# Patient Record
Sex: Male | Born: 1935 | Race: White | Hispanic: No | Marital: Married | Smoking: Former smoker
Health system: Southern US, Community
[De-identification: ages and names within clinical notes are randomized; demographics above are authoritative.]

## PROBLEM LIST (undated history)

## (undated) DIAGNOSIS — F0390 Unspecified dementia without behavioral disturbance: Secondary | ICD-10-CM

## (undated) DIAGNOSIS — K219 Gastro-esophageal reflux disease without esophagitis: Secondary | ICD-10-CM

## (undated) DIAGNOSIS — I2510 Atherosclerotic heart disease of native coronary artery without angina pectoris: Secondary | ICD-10-CM

## (undated) DIAGNOSIS — E119 Type 2 diabetes mellitus without complications: Secondary | ICD-10-CM

## (undated) DIAGNOSIS — I10 Essential (primary) hypertension: Secondary | ICD-10-CM

## (undated) DIAGNOSIS — R109 Unspecified abdominal pain: Secondary | ICD-10-CM

## (undated) DIAGNOSIS — M1990 Unspecified osteoarthritis, unspecified site: Secondary | ICD-10-CM

## (undated) HISTORY — DX: Unspecified abdominal pain: R10.9

---

## 1999-01-03 HISTORY — PX: COLONOSCOPY: SHX174

## 2002-10-01 ENCOUNTER — Inpatient Hospital Stay (HOSPITAL_COMMUNITY): Admission: EM | Admit: 2002-10-01 | Discharge: 2002-10-04 | Payer: Self-pay | Admitting: *Deleted

## 2002-10-01 ENCOUNTER — Encounter: Payer: Self-pay | Admitting: Cardiology

## 2003-12-16 ENCOUNTER — Ambulatory Visit (HOSPITAL_COMMUNITY): Admission: RE | Admit: 2003-12-16 | Discharge: 2003-12-16 | Payer: Self-pay | Admitting: *Deleted

## 2004-10-10 ENCOUNTER — Ambulatory Visit: Payer: Self-pay | Admitting: Cardiology

## 2005-05-15 ENCOUNTER — Ambulatory Visit: Payer: Self-pay | Admitting: Cardiology

## 2005-08-18 ENCOUNTER — Ambulatory Visit: Payer: Self-pay | Admitting: Cardiology

## 2005-10-10 ENCOUNTER — Ambulatory Visit: Payer: Self-pay | Admitting: Cardiology

## 2005-10-11 ENCOUNTER — Ambulatory Visit: Payer: Self-pay | Admitting: Cardiology

## 2006-03-05 ENCOUNTER — Ambulatory Visit: Payer: Self-pay | Admitting: Cardiology

## 2006-11-09 ENCOUNTER — Ambulatory Visit: Payer: Self-pay | Admitting: Cardiology

## 2007-10-14 ENCOUNTER — Ambulatory Visit: Payer: Self-pay | Admitting: Cardiology

## 2007-11-06 ENCOUNTER — Ambulatory Visit: Payer: Self-pay | Admitting: Cardiology

## 2007-11-08 ENCOUNTER — Ambulatory Visit: Payer: Self-pay | Admitting: Internal Medicine

## 2008-10-30 HISTORY — PX: PACEMAKER INSERTION: SHX728

## 2010-02-25 HISTORY — PX: SIGMOIDOSCOPY: SUR1295

## 2010-08-15 ENCOUNTER — Encounter (INDEPENDENT_AMBULATORY_CARE_PROVIDER_SITE_OTHER): Payer: Self-pay | Admitting: *Deleted

## 2010-09-13 ENCOUNTER — Ambulatory Visit: Payer: Self-pay | Admitting: Internal Medicine

## 2010-09-14 ENCOUNTER — Ambulatory Visit (HOSPITAL_COMMUNITY): Admission: RE | Admit: 2010-09-14 | Discharge: 2010-09-14 | Payer: Self-pay | Admitting: Internal Medicine

## 2010-09-28 ENCOUNTER — Ambulatory Visit (HOSPITAL_COMMUNITY): Admission: RE | Admit: 2010-09-28 | Discharge: 2010-09-28 | Payer: Self-pay | Admitting: Internal Medicine

## 2010-09-28 ENCOUNTER — Ambulatory Visit: Payer: Self-pay | Admitting: Internal Medicine

## 2010-09-28 HISTORY — PX: ESOPHAGOGASTRODUODENOSCOPY: SHX1529

## 2010-11-29 NOTE — Letter (Signed)
Summary: Generic Letter, Intro to Referring  [facility]  [address]   [place], [place] [address]   Phone: [phone]  Fax: [phone]      August 15, 2010             RE: Brad Klein   July 06, 1929                 [address]                 [place], [place]  [address]  Dear Brad Klein,  Patient cancelled his appointment on Aug 08, 2010 at 3pm with Richrd Prime            Sincerely,    Diana Eves  [facility] Ph: [phone]   Fax: [phone]

## 2011-01-10 LAB — H. PYLORI ANTIBODY, IGG: H Pylori IgG: <0.4 {ISR}

## 2011-03-17 NOTE — Discharge Summary (Signed)
NAME:  Brad Klein, Brad Klein NO.:  [id number]   MEDICAL RECORD NO.:  [id number]                   PATIENT TYPE:  INP   LOCATION:  2029                                 FACILITY:  [facility]   PHYSICIAN:  Darden Palmer., M.D.         DATE OF BIRTH:  08-26-1929   DATE OF ADMISSION:  10/01/2002  DATE OF DISCHARGE:  10/04/2002                                 DISCHARGE SUMMARY   FINAL DIAGNOSES:  1. Probable viral syndrome with flu.  2. Chest pain of undetermined etiology with positive  CPK-MB but negative     troponin I.  Catheterization showing moderate disease in proximal left     anterior descending artery.  3. Diabetes mellitus, insulin-dependent.  4. Hypertension.  5. Syncope of undetermined origin.   HISTORY OF PRESENT ILLNESS:  This 77 year old male had a prior history of an  LAD stenosis in 1979 and moderate disease in the right coronary artery.  The  evening of admission, he awoke to go the bathroom and he was feeling poorly  with diarrhea and had diaphoresis, dyspnea, nausea and vomiting and had a  syncopal episode.  He was taken to the emergency room and developed chest  pain when he walked to the stretcher when EMS picked him up.  He continued  to have mild to moderate chest pain and become hypertensive with  nitroglycerin and morphine.  CPK showed mild elevation, but troponin's were  normal.  He was transferred to [facility].  He had been feeling poorly  for several days prior to admission.  He was transferred from [facility] for further evaluation.  (Please see the previously dictated  History and Physical for remainder of the details).   HOSPITAL COURSE:  Lab data on admission showed hemoglobin of 12.9,  hematocrit of 38.  Sodium 126 on admission with a potassium of 4.5, chloride  was 94, glucose was 193 on admission.  AST was elevated at 38 and ALT was  elevated at 45.  CPK was 267 with MB of 9.5 and remained at this level  serially.  Troponin I's were all normal.  By the time of discharge, his  sodium had risen to 134.  He had an emergent cardiac catheterization done by  Meade Maw, M.D. when he came in showing a 50-60% ostial LAD stenosis.  Circumflex showed no significant disease.  Right coronary had a 40-50% mid  vessel stenosis.  His catheterization was reviewed and it was felt that he  had only moderate coronary disease.  Sheath was removed later on that day.  He had a syncopal episode while going down to ultrasound after having nausea  and diaphoresis.  He was reportedly in sinus rhythm with a bradycardia  around 50 at that time.  EKG remained unchanged.  He continued to be  somewhat hyponatremic and he was given intravenous fluids.  He had no more  nausea or  vomiting and felt better the next day and denied any chest pain  and his abdominal symptoms were better.  A gallbladder ultrasound did not  show any gallstones.  It was felt that he likely had vasovagal syncope and  most likely a viral syndrome with nausea and vomiting.  He remained in sinus  rhythm the next day, but was still aching all over and congested.  The  impression was that he had hyponatremia which was improving and had probable  flu with vasovagal syncope.  He was feeling well the next morning and was  discharged home to followup in one week.   DISCHARGE CONDITION:  Improved.   DISCHARGE DIET:  A low fat diet, diabetic diet.   DISCHARGE MEDICATIONS:  1. Lisinopril 40 mg daily.  2. Adenocard 40 mg daily.  3. Aspirin 325 daily.  4. Glyburide 5 mg daily.  5. Glucophage 500 mg twice daily.  6. Humalog 30 units in the morning and evening.  7. Humulin N 40 units in the morning and evening.  8. Zocor 20 mg daily.  9. Pepcid 20 mg daily.   DISCHARGE INSTRUCTIONS:  He is to call for an appointment to see me in one  week and to call if there were problems.  He is instructed to follow up also  with his regular medical doctor.                                                Darden Palmer., M.D.    WST/MEDQ  D:  11/06/2002  T:  11/06/2002  Job:  [id number]

## 2011-03-17 NOTE — Assessment & Plan Note (Signed)
[facility]                          [facility] OFFICE NOTE   Brad, Klein                        MRN:          [id number]  DATE:11/09/2006                            DOB:          Jul 25, 1929    PRIMARY CARDIOLOGIST:  Dr.  Simona Huh   REASON FOR VISIT:  Scheduled 6 month followup.   Brad Klein is a very pleasant 77 year old male, with history of  nonobstructive coronary artery disease by multiple prior cardiac  catheterizations, who now presents for scheduled followup.   Since last seen here in the clinic in May 2007; by Dr. Diona Browner, the  patient reports no interim development of any signs or symptoms  suggestive of unstable angina pectoris.  At time of last office visit,  recommendation was to discontinue Toprol regarding a possible relative  chronotropic  incompetence with underlying conductive system disease  which, in turn, could be precipitating exertional chest discomfort.  However, the patient reports to me today, that he was not able to  tolerate this given that his blood pressure significantly increased off  of the Toprol.  He was thus advised by our office to resume this  medication.   From a clinical standpoint, therefore, the patient continues to have  what appears to be chronic stable angina.  In fact, he continues to walk  on a regular basis and describes symptoms suggestive of warm-up angina  shortly after he starts walking, but which then completely resolves with  continued walking.  This has not increased in either intensity or  frequency.   CURRENT MEDICATIONS:  1. Benicar 40 daily.  2. Aspirin 81 daily.  3. Maxzide 37.5/25 daily.  4. Glyburide 5 b.i.d.  5. Metformin 500 t.i.d.  6. Prilosec OTC 20 daily.  7. Humulin N 40 units q. a.m./30 units q. p.m.  8. Humalog 30 units q. a.m./25 unites q. p.m.  9. Toprol XL 25 daily.  10.Norvasc 5 daily.   PHYSICAL EXAMINATION:  Blood pressure 132/70, pulse 68,  regular.  Weight  222.  GENERAL:  A 78 year old male sitting upright in no distress.  HEENT:  Normocephalic, atraumatic.  NECK:  Palpable bilateral carotid pulses without bruits.  LUNGS:  Clear to auscultation in all fields.  HEART:  Regular rate and rhythm (S1) no significant murmurs.  No rubs or  gallops.  ABDOMEN:  Soft, nontender.  EXTREMITIES:  Palpable distal pulses with no significant edema.  NEURO:  Flat affect, but no focal deficit.   REVIEW OF SYSTEMS:  No PND, orthopnea, or significant lower extremity  edema.  The remaining systems negative.   IMPRESSION:  1. Chronic stable angina pectoris.      a.     Suspect secondary to microvascular disease.      b.     Nonobstructive coronary artery disease by multiple prior       cardiac catheterizations, most recently February 2005.  2. Normal left ventricular function.  3. Dyslipidemia.  4. History of bi-fesicular block/right bundle branch block.  5. Insulin-dependent diabetes mellitus.  6. Hypertension.   PLAN:  1. The patient is  strongly recommended to resume taking a lipid      lowering agent.  He apparently had been on Zocor in the past, but      stopped this on his own, secondary to development of myalgias,      which he attributed to this medication.  However, I spoke at length      regarding the importance as well as current guideline      recommendations with respect to being on statin with known diabetes      mellitus.  The patient subsequently agreed to resuming his statin      and I will therefore place him on Crestor 10 q.d.  He will need a      follow up fasting lipids/liver profile in 3 months.  I have also      asked him to forward a copy of his upcoming fasting lipid profile      from Dr. Bonnita Levan office.  2. Schedule a return clinic followup with Dr. Simona Huh in 6      months.      Rozell Searing, PA-C  Electronically Signed      Jonelle Sidle, MD  Electronically Signed   GS/MedQ  DD:  11/09/2006  DT: 11/09/2006  Job #: [id number]   cc:   Doreen Beam

## 2011-03-17 NOTE — Letter (Signed)
November 11, 2007    Learta Codding, MD,FACC  [address]  [place], [place] [address]   RE:  EKIN, PILAR  MRN:  [id number]  /  DOB:  1929-08-22   Dear Michelle Piper:   It was a pleasure to see Brad Klein last Friday, November 08, 2007 at  your request regarding recurrent syncope.   He was admitted to [facility] and has had three episodes.  The  concern on evaluation was that he had bifascicular block.   His first episode occurred about 10 years.  His EKG apparently was  normal then.  He was eating supper and he developed abdominal pain.  He  went outside of the church and then came back in and lost consciousness.  Two years ago, he had another episode where he was in the bathroom and  got nauseated.  He sat on the tub.  The nausea increased and he went out  on the floor.  He was unable to stand for some time.   The most recent episode occurred having gone to the bathroom and  defecated.  He became quite nauseated again.  He had a prodrome that was  similar to the others in that he recognized what was happening to him.  He was noted by his wife to be quite diaphoretic and pale and then lost  consciousness.  We do not have access to the EMS records for his viral  illness with accompanying flu-like symptoms.  His catheterization  demonstrated normal left ventricular function, nonobstructive coronary  artery disease.  This was a number of years ago in 2003.  He also has a  history of hypertension.   PAST MEDICAL HISTORY:  In addition to above is notable for dyslipidemia,  diabetes, BPH, and neuropathy.   MEDICATIONS:  At the time of his hospitalization included:  1. Benicar 40 mg.  2. Norvasc 10 mg.  3. Aspirin.  4. Glucophage 500 mg t.i.d.  5. Toprol 25 mg.  6. Maxzide 37.5/25.  7. Glyburide 10 mg b.i.d.  8. Insulin.   ALLERGIES:  PENICILLIN.   REVIEW OF SYSTEMS:  Broadly negative.   FAMILY HISTORY:  Noncontributory.   PHYSICAL EXAMINATION:  GENERAL:  He is an elderly  Caucasian male  appearing his stated age of 84.  VITAL SIGNS:  Blood pressure 145/77, pulse 78, weight 247 pounds.  HEENT:  No icterus or xanthomata.  His neck veins were flat.  His  carotids were brisk and full bilaterally without bruits.  BACK:  Without kyphosis or scoliosis.  LUNGS:  Clear.  HEART:  Sounds were irregular, but S1 was diminished and there was an  early systolic murmur.  ABDOMEN:  Protuberant, but soft.  There was no midline pulsation or  hepatosplenomegaly.  EXTREMITIES:  Femoral pulses were 2+.  Distal pulses were intact.  There  is no cyanosis, clubbing, or edema.  NEUROLOGY:  Grossly normal.  SKIN:  Warm and dry.   Electrocardiogram demonstrated trifascicular block with right bundle  branch block, first degree AV block, and left anterior fascicular block.  A CardioNet monitor was obtained which demonstrated only some  irregularities of rhythm, but no significant change in rate.   IMPRESSION:  1. Recurrent syncope with a history of consistent with neurally      mediated syncope.  2. Trifascicular block.  3. Normal left ventricular function.  4. Diabetes.  5. Hypertension.   DISCUSSION:  Michelle Piper, Brad Klein has recurrent syncope with trifascicular  block.  Given his normal left ventricular function the likelihood of a  life-threatening ventricular arrhythmia is quite small.  Based on the  issue data from [place] in [place], patients with bundle branch block  have typically about a 50% likelihood of having heart block and/or sinus  node dysfunction as the cause of their syncope.  I suspect in this  gentleman that his likelihood is that or maybe less than that given the  strong history suggestive of a neurally mediated reflex.  I do think,  however, given his heart block baseline that loop recording is indicated  because there is some significant chance that an electrical abnormality  is responsible for his syncope as opposed to a neural reflex.  To that  end, I  think a loop recorder is probably the most useful tool in this  regard.   We will plan to proceed with that.  Thank you very much for asking [place] to  see him.    Sincerely,      Duke Salvia, MD, [facility]  Electronically Signed    SCK/MedQ  DD: 11/11/2007  DT: 11/11/2007  Job #: [id number]   CC:    Doreen Beam, MD

## 2011-03-17 NOTE — Cardiovascular Report (Signed)
NAME:  Brad Klein, Brad Klein                           ACCOUNT NO.:  [id number]   MEDICAL RECORD NO.:  [id number]                   PATIENT TYPE:  OIB   LOCATION:  2899                                 FACILITY:  [facility]   PHYSICIAN:  Rollene Rotunda, M.D.                DATE OF BIRTH:  Jan 23, 1929   DATE OF PROCEDURE:  DATE OF DISCHARGE:  12/16/2003                              CARDIAC CATHETERIZATION   PROCEDURES PERFORMED:  1. Left heart catheterization.  2. Coronary arteriography.   CARDIOLOGIST:  Rollene Rotunda, M.D.   INDICATIONS:  Evaluate patient with chest pain (substernal and suggestion of  unstable angina).  He had previous nonobstructive coronary disease on  catheterization most recently in December 2003.  He also had an abnormal  Cardiolite suggesting inferior wall infarct with reversibility, but well-  preserved ejection fraction.   PROCEDURAL NOTE:  Left heart catheterization was performed via the right  femoral artery.  The artery was cannulated using anterior wall puncture.  A  #6 French arterial sheath was inserted via the modified Seldinger technique.  Preformed Judkins and a pigtail catheter were utilized.   The patient tolerated the procedure well and left the lab in stable  condition.   RESULTS:   HEMODYNAMIC DATA:  LV 152/9.  AO 143/98.   ANGIOGRAPHIC DATA:  Coronaries  Left Main:  The left main was normal.   LAD:  The LAD had ostial long 30% stenosis followed by mid 50% stenosis  followed by diffuse luminal irregularities.   Circumflex:  The circumflex was large with diffuse luminal irregularities in  the AV groove.  There was a large mid obtuse marginal, which had luminal  irregularities.   Right Coronary Artery:  The right coronary artery is a large dominant vessel  with slow flow.  There was a mid shelf-like 50% stenosis.   VENTRICULOGRAPHIC DATA:  Left Ventriculogram:  The left ventriculogram was  obtained in the RAO projection.  The EF was 65%.   CONCLUSION:  Nonobstructive coronary disease.   I did review the old catheterization and reviewed the films with Dr.  Gerri Spore.  We both agree that the mid right coronary artery lesion is  unlikely to be obstructing any flow.  It is unchanged and identical to the  film in 2003 as is the left anterior descending lesion.   PLAN:  I discussed the results with the patient and the family.  No further  cardiovascular testing is suggested.  The patient will follow up with Dr.  Sherril Croon.                                               Rollene Rotunda, M.D.    JH/MEDQ  D:  12/16/2003  T:  12/17/2003  Job:  [id number]

## 2011-03-17 NOTE — Cardiovascular Report (Signed)
NAME:  Brad Klein, Brad Klein NO.:  [id number]   MEDICAL RECORD NO.:  [id number]                   PATIENT TYPE:  INP   LOCATION:  2923                                 FACILITY:  [facility]   PHYSICIAN:  Meade Maw, M.D.                 DATE OF BIRTH:  December 19, 1928   DATE OF PROCEDURE:  DATE OF DISCHARGE:                              CARDIAC CATHETERIZATION   PROCEDURES PERFORMED:  Left heart catheterization, single plane  ventriculogram, coronary angiography.   INDICATIONS FOR PROCEDURE:  Ongoing prolonged chest pain with elevated  cardiac enzymes.   DESCRIPTION OF PROCEDURE:  After obtaining written informed consent, the  patient was brought to the cardiac catheterization laboratory on an urgent  basis. The right groin was prepped and draped in the usual sterile fashion.  Local anesthesia was achieved using 1% Xylocaine. A 6 French hemostasis  sheath was placed into the right femoral artery using a modified Seldinger  technique. Selective coronary angiography was performed using a JL4, JR4  Judkins catheter.  However, a JL5 could have been easily used.  Single plane  ventriculogram was performed in the RAO position using a 6 French angled  pigtail catheter.  All catheter exchange were made with a guide wire.  The  hemostasis sheath was flushed following each engagement.   FINDINGS:  Aortic pressure was 138/62, LV pressure was 129/70, EDP was 16.  Single plane ventriculogram revealed normal wall motion.  The ejection  fraction was 65%.   CORONARY ANGIOGRAPHY:  Left main coronary artery:  The left main coronary  artery bifurcated into the left anterior descending and circumflex vessel.  There is no significant disease in the left main coronary artery.   Left anterior descending:  The left anterior descending gives rise to a  small diagonal #1, small diagonal  #2, a large septal perforator and then goes on to end as an apical branch.  There is an ostial  50% lesion noted in the left anterior descending.   Circumflex vessel:  The circumflex vessel was a moderate sized vessel giving  rise to a small OM-1, small OM-2, a large trifurcating OM-3 and an OM-4.  There are luminal irregularities noted in the circumflex vessel.   Right coronary artery:  The right coronary artery is a large ectatic artery  giving rise to two RV marginals, a large PDA and a large PL branch.  There  was a 40% mid vessel lesion noted in the right coronary artery.   FINAL IMPRESSION:  Noncritical coronary artery disease with preserved left  ventricular function.  There is no critical lesion to account for the  patient's bump in cardiac enzymes.   The patient is transferred back to coronary care unit.  The hemostasis  sheath will be left in place till 8 o'clock as the patient received Lovenox  at 2 a.m.  The films will be reviewed by Dr.  Viann Fish to determine if  intervention on the ostial LAD is warranted.                                                  Meade Maw, M.D.    HP/MEDQ  D:  10/01/2002  T:  10/01/2002  Job:  [id number]

## 2011-05-29 ENCOUNTER — Encounter (INDEPENDENT_AMBULATORY_CARE_PROVIDER_SITE_OTHER): Payer: Self-pay

## 2011-05-30 ENCOUNTER — Ambulatory Visit (INDEPENDENT_AMBULATORY_CARE_PROVIDER_SITE_OTHER): Payer: Self-pay | Admitting: Internal Medicine

## 2011-05-30 ENCOUNTER — Encounter (INDEPENDENT_AMBULATORY_CARE_PROVIDER_SITE_OTHER): Payer: Self-pay

## 2013-11-30 HISTORY — PX: INTRAMEDULLARY (IM) NAIL INTERTROCHANTERIC: SHX5875

## 2013-12-19 ENCOUNTER — Emergency Department (HOSPITAL_COMMUNITY): Payer: Medicare Other

## 2013-12-19 ENCOUNTER — Inpatient Hospital Stay (HOSPITAL_COMMUNITY)
Admission: EM | Admit: 2013-12-19 | Discharge: 2013-12-23 | DRG: 481 | Disposition: A | Discharge status: Skilled Nursing Facility | Payer: Medicare Other | Attending: Internal Medicine | Admitting: Internal Medicine

## 2013-12-19 ENCOUNTER — Encounter (HOSPITAL_COMMUNITY): Payer: Self-pay | Admitting: Emergency Medicine

## 2013-12-19 DIAGNOSIS — Y92009 Unspecified place in unspecified non-institutional (private) residence as the place of occurrence of the external cause: Secondary | ICD-10-CM

## 2013-12-19 DIAGNOSIS — S72009A Fracture of unspecified part of neck of unspecified femur, initial encounter for closed fracture: Secondary | ICD-10-CM

## 2013-12-19 DIAGNOSIS — S72143A Displaced intertrochanteric fracture of unspecified femur, initial encounter for closed fracture: Principal | ICD-10-CM | POA: Diagnosis present

## 2013-12-19 DIAGNOSIS — I2510 Atherosclerotic heart disease of native coronary artery without angina pectoris: Secondary | ICD-10-CM | POA: Diagnosis present

## 2013-12-19 DIAGNOSIS — E119 Type 2 diabetes mellitus without complications: Secondary | ICD-10-CM | POA: Diagnosis present

## 2013-12-19 DIAGNOSIS — I10 Essential (primary) hypertension: Secondary | ICD-10-CM | POA: Diagnosis present

## 2013-12-19 DIAGNOSIS — S72001A Fracture of unspecified part of neck of right femur, initial encounter for closed fracture: Secondary | ICD-10-CM | POA: Diagnosis present

## 2013-12-19 DIAGNOSIS — E871 Hypo-osmolality and hyponatremia: Secondary | ICD-10-CM | POA: Diagnosis present

## 2013-12-19 DIAGNOSIS — Z7982 Long term (current) use of aspirin: Secondary | ICD-10-CM

## 2013-12-19 DIAGNOSIS — Z950 Presence of cardiac pacemaker: Secondary | ICD-10-CM

## 2013-12-19 DIAGNOSIS — W010XXA Fall on same level from slipping, tripping and stumbling without subsequent striking against object, initial encounter: Secondary | ICD-10-CM | POA: Diagnosis present

## 2013-12-19 DIAGNOSIS — F0390 Unspecified dementia without behavioral disturbance: Secondary | ICD-10-CM | POA: Diagnosis present

## 2013-12-19 DIAGNOSIS — Z794 Long term (current) use of insulin: Secondary | ICD-10-CM

## 2013-12-19 DIAGNOSIS — D62 Acute posthemorrhagic anemia: Secondary | ICD-10-CM | POA: Diagnosis not present

## 2013-12-19 DIAGNOSIS — Z87891 Personal history of nicotine dependence: Secondary | ICD-10-CM

## 2013-12-19 HISTORY — DX: Essential (primary) hypertension: I10

## 2013-12-19 HISTORY — DX: Gastro-esophageal reflux disease without esophagitis: K21.9

## 2013-12-19 HISTORY — DX: Type 2 diabetes mellitus without complications: E11.9

## 2013-12-19 HISTORY — DX: Unspecified dementia, unspecified severity, without behavioral disturbance, psychotic disturbance, mood disturbance, and anxiety: F03.90

## 2013-12-19 HISTORY — DX: Atherosclerotic heart disease of native coronary artery without angina pectoris: I25.10

## 2013-12-19 HISTORY — DX: Unspecified osteoarthritis, unspecified site: M19.90

## 2013-12-19 LAB — CBC WITH DIFFERENTIAL/PLATELET
BASOS ABS: 0.1 10*3/uL (ref 0.0–0.1)
Basophils Relative: 0 % (ref 0–1)
Eosinophils Absolute: 0.2 10*3/uL (ref 0.0–0.7)
Eosinophils Relative: 2 % (ref 0–5)
HCT: 41.8 % (ref 39.0–52.0)
Hemoglobin: 15 g/dL (ref 13.0–17.0)
LYMPHS ABS: 1.5 10*3/uL (ref 0.7–4.0)
Lymphocytes Relative: 12 % (ref 12–46)
MCH: 31.4 pg (ref 26.0–34.0)
MCHC: 35.9 g/dL (ref 30.0–36.0)
MCV: 87.6 fL (ref 78.0–100.0)
Monocytes Absolute: 0.7 10*3/uL (ref 0.1–1.0)
Monocytes Relative: 5 % (ref 3–12)
NEUTROS ABS: 10.7 10*3/uL — AB (ref 1.7–7.7)
NEUTROS PCT: 81 % — AB (ref 43–77)
PLATELETS: 170 10*3/uL (ref 150–400)
RBC: 4.77 MIL/uL (ref 4.22–5.81)
RDW: 12.1 % (ref 11.5–15.5)
WBC: 13.1 10*3/uL — ABNORMAL HIGH (ref 4.0–10.5)

## 2013-12-19 LAB — BASIC METABOLIC PANEL
BUN: 22 mg/dL (ref 6–23)
CO2: 23 meq/L (ref 19–32)
Calcium: 9.5 mg/dL (ref 8.4–10.5)
Chloride: 95 mEq/L — ABNORMAL LOW (ref 96–112)
Creatinine, Ser: 0.82 mg/dL (ref 0.50–1.35)
GFR calc Af Amer: >90 mL/min (ref >90)
GFR, EST NON AFRICAN AMERICAN: 80 mL/min — AB (ref >90)
Glucose, Bld: 226 mg/dL — ABNORMAL HIGH (ref 70–99)
Potassium: 4.5 mEq/L (ref 3.7–5.3)
SODIUM: 131 meq/L — AB (ref 137–147)

## 2013-12-19 LAB — GLUCOSE, CAPILLARY: Glucose-Capillary: 210 mg/dL — ABNORMAL HIGH (ref 70–99)

## 2013-12-19 LAB — TYPE AND SCREEN
ABO/RH(D): A POS
ANTIBODY SCREEN: NEGATIVE

## 2013-12-19 LAB — CBG MONITORING, ED: Glucose-Capillary: 206 mg/dL — ABNORMAL HIGH (ref 70–99)

## 2013-12-19 LAB — ABO/RH: ABO/RH(D): A POS

## 2013-12-19 LAB — PROTIME-INR
INR: 1.11 (ref 0.00–1.49)
PROTHROMBIN TIME: 14.1 s (ref 11.6–15.2)

## 2013-12-19 MED ORDER — MORPHINE SULFATE 4 MG/ML IJ SOLN
4.0000 mg | Freq: Once | INTRAMUSCULAR | Status: AC
  Administered 2013-12-19: 4 mg via INTRAVENOUS
  Filled 2013-12-19: qty 1

## 2013-12-19 MED ORDER — PANTOPRAZOLE SODIUM 40 MG PO TBEC
80.0000 mg | DELAYED_RELEASE_TABLET | Freq: Every day | ORAL | Status: DC
  Administered 2013-12-21 – 2013-12-23 (×3): 80 mg via ORAL
  Filled 2013-12-19 (×3): qty 2

## 2013-12-19 MED ORDER — AMLODIPINE BESYLATE 10 MG PO TABS
10.0000 mg | ORAL_TABLET | Freq: Every day | ORAL | Status: DC
  Administered 2013-12-21 – 2013-12-23 (×3): 10 mg via ORAL
  Filled 2013-12-19 (×4): qty 1

## 2013-12-19 MED ORDER — DONEPEZIL HCL 5 MG PO TABS
5.0000 mg | ORAL_TABLET | Freq: Every day | ORAL | Status: DC
  Administered 2013-12-20 – 2013-12-22 (×5): 5 mg via ORAL
  Filled 2013-12-19 (×7): qty 1

## 2013-12-19 MED ORDER — MEMANTINE HCL ER 28 MG PO CP24
28.0000 mg | ORAL_CAPSULE | Freq: Every day | ORAL | Status: DC
  Administered 2013-12-20 – 2013-12-23 (×4): 28 mg via ORAL
  Filled 2013-12-19 (×4): qty 28

## 2013-12-19 MED ORDER — MORPHINE SULFATE 2 MG/ML IJ SOLN
0.5000 mg | INTRAMUSCULAR | Status: DC | PRN
  Administered 2013-12-19: 0.5 mg via INTRAVENOUS
  Filled 2013-12-19: qty 1

## 2013-12-19 MED ORDER — SODIUM CHLORIDE 0.9 % IV SOLN
1000.0000 mL | INTRAVENOUS | Status: DC
  Administered 2013-12-19: 1000 mL via INTRAVENOUS

## 2013-12-19 MED ORDER — AMLODIPINE BESY-BENAZEPRIL HCL 10-20 MG PO CAPS: 1.0000 | ORAL_CAPSULE | Freq: Every day | ORAL | Status: DC

## 2013-12-19 MED ORDER — INSULIN GLARGINE 100 UNIT/ML ~~LOC~~ SOLN
25.0000 [IU] | Freq: Every day | SUBCUTANEOUS | Status: DC
  Administered 2013-12-20: 25 [IU] via SUBCUTANEOUS
  Filled 2013-12-19 (×2): qty 0.25

## 2013-12-19 MED ORDER — HYDROCODONE-ACETAMINOPHEN 5-325 MG PO TABS: 1.0000 | ORAL_TABLET | ORAL | Status: DC | PRN

## 2013-12-19 MED ORDER — ONDANSETRON HCL 4 MG/2ML IJ SOLN: 4.0000 mg | Freq: Three times a day (TID) | INTRAMUSCULAR | Status: DC | PRN

## 2013-12-19 MED ORDER — INSULIN ASPART 100 UNIT/ML ~~LOC~~ SOLN
0.0000 [IU] | SUBCUTANEOUS | Status: DC
  Administered 2013-12-19: 5 [IU] via SUBCUTANEOUS
  Administered 2013-12-20: 2 [IU] via SUBCUTANEOUS
  Administered 2013-12-20: 3 [IU] via SUBCUTANEOUS
  Administered 2013-12-20: 5 [IU] via SUBCUTANEOUS
  Administered 2013-12-20: 8 [IU] via SUBCUTANEOUS
  Administered 2013-12-20 – 2013-12-21 (×2): 3 [IU] via SUBCUTANEOUS
  Administered 2013-12-21: 5 [IU] via SUBCUTANEOUS
  Filled 2013-12-19: qty 1

## 2013-12-19 MED ORDER — HYDROCODONE-ACETAMINOPHEN 5-325 MG PO TABS
1.0000 | ORAL_TABLET | Freq: Four times a day (QID) | ORAL | Status: DC | PRN
  Administered 2013-12-19: 1 via ORAL
  Administered 2013-12-20 – 2013-12-23 (×8): 2 via ORAL
  Filled 2013-12-19: qty 2
  Filled 2013-12-19: qty 1
  Filled 2013-12-19 (×8): qty 2

## 2013-12-19 MED ORDER — BENAZEPRIL HCL 20 MG PO TABS
20.0000 mg | ORAL_TABLET | Freq: Every day | ORAL | Status: DC
  Administered 2013-12-20 – 2013-12-23 (×4): 20 mg via ORAL
  Filled 2013-12-19 (×4): qty 1

## 2013-12-19 MED ORDER — POTASSIUM CHLORIDE IN NACL 20-0.45 MEQ/L-% IV SOLN
INTRAVENOUS | Status: DC
  Filled 2013-12-19 (×2): qty 1000

## 2013-12-19 MED ORDER — CHLORHEXIDINE GLUCONATE 4 % EX LIQD
60.0000 mL | Freq: Once | CUTANEOUS | Status: AC
  Administered 2013-12-20: 4 via TOPICAL
  Filled 2013-12-19: qty 60

## 2013-12-19 MED ORDER — ASPIRIN EC 325 MG PO TBEC
325.0000 mg | DELAYED_RELEASE_TABLET | Freq: Every day | ORAL | Status: DC
Start: 2013-12-20 — End: 2013-12-23
  Administered 2013-12-21 – 2013-12-23 (×3): 325 mg via ORAL
  Filled 2013-12-19 (×4): qty 1

## 2013-12-19 MED ORDER — ASPIRIN 81 MG PO TABS: 81.0000 mg | ORAL_TABLET | Freq: Every day | ORAL | Status: DC

## 2013-12-19 MED ORDER — LISINOPRIL 40 MG PO TABS
40.0000 mg | ORAL_TABLET | Freq: Every day | ORAL | Status: DC
  Administered 2013-12-20 – 2013-12-22 (×3): 40 mg via ORAL
  Filled 2013-12-19 (×6): qty 1

## 2013-12-19 MED ORDER — SODIUM CHLORIDE 0.9 % IV SOLN
INTRAVENOUS | Status: DC
  Administered 2013-12-19 – 2013-12-21 (×2): via INTRAVENOUS

## 2013-12-19 NOTE — ED Notes (Signed)
Phlebotomy to bedside, taking pt to xray. Will call when he returns.

## 2013-12-19 NOTE — Consult Note (Signed)
Reason for Consult: Right intertrochanteric hip fracture Referring Physician: Dr. [name] is an 80 y.o. male.  HPI: Patient 80 year old gentleman with dementia who slipped on ice and sustained a right intertrochanteric hip fracture.  Past Medical History  Diagnosis Date  . Abdominal pain, other specified site   . Duodenal ulcer   . Dementia   . Diabetes mellitus without complication   . Hypertension     Past Surgical History  Procedure Laterality Date  . Esophagogastroduodenoscopy  09/28/2010    egd/tcs  . Sigmoidoscopy  02/25/2010  . Colonoscopy  01/03/1999  . Pacemaker insertion  10/2008    No family history on file.  Social History:  reports that he has quit smoking. He does not have any smokeless tobacco history on file. He reports that he does not drink alcohol. His drug history is not on file.  Allergies:  Allergies  Allergen Reactions  . Penicillins Other (See Comments)    Medications: I have reviewed the patient's current medications.  Results for orders placed during the hospital encounter of 12/19/13 (from the past 48 hour(s))  BASIC METABOLIC PANEL     Status: Abnormal   Collection Time    12/19/13  6:33 PM      Result Value Ref Range   Sodium 131 (*) 137 - 147 mEq/L   Potassium 4.5  3.7 - 5.3 mEq/L   Chloride 95 (*) 96 - 112 mEq/L   CO2 23  19 - 32 mEq/L   Glucose, Bld 226 (*) 70 - 99 mg/dL   BUN 22  6 - 23 mg/dL   Creatinine, Ser 0.82  0.50 - 1.35 mg/dL   Calcium 9.5  8.4 - 10.5 mg/dL   GFR calc non Af Amer 80 (*) >90 mL/min   GFR calc Af Amer >90  >90 mL/min   Comment: (NOTE)     The eGFR has been calculated using the CKD EPI equation.     This calculation has not been validated in all clinical situations.     eGFR's persistently <90 mL/min signify possible Chronic Kidney     Disease.  CBC WITH DIFFERENTIAL     Status: Abnormal   Collection Time    12/19/13  6:33 PM      Result Value Ref Range   WBC 13.1 (*) 4.0 - 10.5 K/uL   RBC 4.77  4.22 - 5.81 MIL/uL   Hemoglobin 15.0  13.0 - 17.0 g/dL   HCT 41.8  39.0 - 52.0 %   MCV 87.6  78.0 - 100.0 fL   MCH 31.4  26.0 - 34.0 pg   MCHC 35.9  30.0 - 36.0 g/dL   RDW 12.1  11.5 - 15.5 %   Platelets 170  150 - 400 K/uL   Neutrophils Relative % 81 (*) 43 - 77 %   Neutro Abs 10.7 (*) 1.7 - 7.7 K/uL   Lymphocytes Relative 12  12 - 46 %   Lymphs Abs 1.5  0.7 - 4.0 K/uL   Monocytes Relative 5  3 - 12 %   Monocytes Absolute 0.7  0.1 - 1.0 K/uL   Eosinophils Relative 2  0 - 5 %   Eosinophils Absolute 0.2  0.0 - 0.7 K/uL   Basophils Relative 0  0 - 1 %   Basophils Absolute 0.1  0.0 - 0.1 K/uL  PROTIME-INR     Status: None   Collection Time    12/19/13  6:33 PM  Result Value Ref Range   Prothrombin Time 14.1  11.6 - 15.2 seconds   INR 1.11  0.00 - 1.49  TYPE AND SCREEN     Status: None   Collection Time    12/19/13  6:33 PM      Result Value Ref Range   ABO/RH(D) A POS     Antibody Screen NEG     Sample Expiration 12/22/2013    ABO/RH     Status: None   Collection Time    12/19/13  6:33 PM      Result Value Ref Range   ABO/RH(D) A POS      Dg Chest 1 View  12/19/2013   CLINICAL DATA:  Fall, diabetes, hypertension, right hip fracture  EXAM: CHEST - 1 VIEW  COMPARISON:  12/15/2013  FINDINGS: Left subclavian 2 lead pacer noted. Normal heart size and vascularity. Mild hyperinflation without edema or pneumonia. No effusion or pneumothorax. Trachea midline. Tortuous ectatic thoracic aorta noted. Degenerative changes of the spine with an upper thoracic scoliosis.  IMPRESSION: No superimposed acute process   Electronically Signed   By: Daryll Brod M.D.   On: 12/19/2013 19:01   Dg Hip Complete Right  12/19/2013   CLINICAL DATA:  [place] and injured right hip.  EXAM: RIGHT HIP - COMPLETE 2+ VIEW  COMPARISON:  None.  FINDINGS: Comminuted intertrochanteric right femoral neck fracture. Right hip joint intact with moderate joint space narrowing.  Included AP pelvis demonstrates  no fractures elsewhere. Contralateral left hip joint intact with symmetric moderate joint space narrowing. Sacroiliac joints and symphysis pubis intact. Mild degenerative changes involving the visualized lower lumbar spine.  IMPRESSION: Comminuted intertrochanteric right femoral neck fracture.   Electronically Signed   By: Evangeline Dakin M.D.   On: 12/19/2013 18:52    Review of Systems  All other systems reviewed and are negative.   Blood pressure 123/65, pulse 59, temperature 98.7 F (37.1 C), temperature source Oral, resp. rate 20, SpO2 100.00%. Physical Exam On examination patient's right lower extremity is shortened and externally rotated. His foot is warm and neurovascularly intact. Radiographs shows a displaced intertrochanteric hip fracture on the right. Assessment/Plan: Assessment: Right intertrochanteric hip fracture.  Plan: We'll plan for intramedullary nail fixation of the hip fracture. Risks and benefits were discussed with the patient and the family including infection neurovascular injury DVT pulmonary embolus need for additional surgery. Patient and family state they understand and wish to proceed at this time. With patient's dementia patient may most likely not be able to ambulate again due to mental status. Anticipate patient will need skilled nursing at discharge.  Brad Klein V 12/19/2013, 8:20 PM

## 2013-12-19 NOTE — ED Notes (Signed)
MD at bedside. 

## 2013-12-19 NOTE — ED Notes (Signed)
Patient transported to X-ray 

## 2013-12-19 NOTE — ED Provider Notes (Signed)
CSN: [id number]     Arrival date & time 12/19/13  1641 History   First MD Initiated Contact with Patient 12/19/13 1649     Chief Complaint  Patient presents with  . Hip Pain     (Consider location/radiation/quality/duration/timing/severity/associated sxs/prior Treatment) HPI 80 year old male with history of dementia pacemaker diabetes was walking down his driveway to check the mail today when he slipped and fell landing in his right hip since with isolated right hip pain only with right leg shortening with external rotation unable to bear weight on his right leg since the fall with no other injury no head or neck injury no amnesia no neck pain chest pain shortness breath palpitations abdominal pain back pain or injury to his arms or his left leg. He is right hip pain only without radiation or associated weakness or numbness. His pain was moderately severe and treated with morphine prior to arrival by EMS does not want pain medication now upon arrival. Past Medical History  Diagnosis Date  . Abdominal pain, other specified site   . Duodenal ulcer   . Dementia   . Diabetes mellitus without complication   . Hypertension    Past Surgical History  Procedure Laterality Date  . Esophagogastroduodenoscopy  09/28/2010    egd/tcs  . Sigmoidoscopy  02/25/2010  . Colonoscopy  01/03/1999  . Pacemaker insertion  10/2008   No family history on file. History  Substance Use Topics  . Smoking status: Former Games developer  . Smokeless tobacco: Not on file  . Alcohol Use: No    Review of Systems 10 Systems reviewed and are negative for acute change except as noted in the HPI.   Allergies  Penicillins  Home Medications   No current outpatient prescriptions on file. BP 113/72  Pulse 67  Temp(Src) 98.1 F (36.7 C) (Oral)  Resp 18  SpO2 100% Physical Exam  Nursing note and vitals reviewed. Constitutional:  Awake, alert, nontoxic appearance.  HENT:  Head: Atraumatic.  Eyes: Right eye  exhibits no discharge. Left eye exhibits no discharge.  Neck: Neck supple.  Cervical spine nontender  Cardiovascular: Normal rate and regular rhythm.   No murmur heard. Pulmonary/Chest: Effort normal and breath sounds normal. No respiratory distress. He has no wheezes. He has no rales. He exhibits no tenderness.  Abdominal: Soft. He exhibits no distension. There is no tenderness. There is no rebound and no guarding.  Musculoskeletal: He exhibits tenderness. He exhibits no edema.  Baseline ROM, no obvious new focal weakness. Both arms and left leg nontender with good movement. Right leg is tender at the right hip only with decreased movement of the hip due to pain. Right knee ankle and foot are nontender. Right foot his capillary refill less than 2 seconds normal light touch good movement.  Neurological: He is alert.  Mental status and motor strength appears baseline for patient and situation.  Skin: No rash noted.  Psychiatric: He has a normal mood and affect.    ED Course  Procedures (including critical care time) D/w Ortho and Med for admit. Patient / Family / Caregiver understand and agree with initial ED impression and plan with expectations set for ED visit. Labs Review Labs Reviewed  SURGICAL PCR SCREEN - Abnormal; Notable for the following:    Staphylococcus aureus POSITIVE (*)    All other components within normal limits  BASIC METABOLIC PANEL - Abnormal; Notable for the following:    Sodium 131 (*)    Chloride 95 (*)  Glucose, Bld 226 (*)    GFR calc non Af Amer 80 (*)    All other components within normal limits  CBC WITH DIFFERENTIAL - Abnormal; Notable for the following:    WBC 13.1 (*)    Neutrophils Relative % 81 (*)    Neutro Abs 10.7 (*)    All other components within normal limits  GLUCOSE, CAPILLARY - Abnormal; Notable for the following:    Glucose-Capillary 210 (*)    All other components within normal limits  GLUCOSE, CAPILLARY - Abnormal; Notable for the  following:    Glucose-Capillary 196 (*)    All other components within normal limits  GLUCOSE, CAPILLARY - Abnormal; Notable for the following:    Glucose-Capillary 175 (*)    All other components within normal limits  GLUCOSE, CAPILLARY - Abnormal; Notable for the following:    Glucose-Capillary 170 (*)    All other components within normal limits  GLUCOSE, CAPILLARY - Abnormal; Notable for the following:    Glucose-Capillary 191 (*)    All other components within normal limits  CBG MONITORING, ED - Abnormal; Notable for the following:    Glucose-Capillary 206 (*)    All other components within normal limits  PROTIME-INR  TYPE AND SCREEN  ABO/RH   Imaging Review Dg Chest 1 View  12/19/2013   CLINICAL DATA:  Fall, diabetes, hypertension, right hip fracture  EXAM: CHEST - 1 VIEW  COMPARISON:  12/15/2013  FINDINGS: Left subclavian 2 lead pacer noted. Normal heart size and vascularity. Mild hyperinflation without edema or pneumonia. No effusion or pneumothorax. Trachea midline. Tortuous ectatic thoracic aorta noted. Degenerative changes of the spine with an upper thoracic scoliosis.  IMPRESSION: No superimposed acute process   Electronically Signed   By: Ruel Favorsrevor  Shick M.D.   On: 12/19/2013 19:01   Dg Hip Complete Right  12/19/2013   CLINICAL DATA:  Larey SeatFell and injured right hip.  EXAM: RIGHT HIP - COMPLETE 2+ VIEW  COMPARISON:  None.  FINDINGS: Comminuted intertrochanteric right femoral neck fracture. Right hip joint intact with moderate joint space narrowing.  Included AP pelvis demonstrates no fractures elsewhere. Contralateral left hip joint intact with symmetric moderate joint space narrowing. Sacroiliac joints and symphysis pubis intact. Mild degenerative changes involving the visualized lower lumbar spine.  IMPRESSION: Comminuted intertrochanteric right femoral neck fracture.   Electronically Signed   By: Hulan Saashomas  Lawrence M.D.   On: 12/19/2013 18:52   Dg Hip Operative Right  12/20/2013    CLINICAL DATA:  Fracture  EXAM: DG OPERATIVE RIGHT HIP  COMPARISON:  None.  FINDINGS: Dynamic compression screw and intra medullary rod are seen transfixing an intertrochanteric femur fracture. Anatomic alignment. No breakage or loosening of the hardware.  IMPRESSION: ORIF intertrochanteric right femur fracture.   Electronically Signed   By: Maryclare BeanArt  Hoss M.D.   On: 12/20/2013 14:00      MDM   Final diagnoses:  Hip fracture, right    The patient appears reasonably stabilized for admission considering the current resources, flow, and capabilities available in the ED at this time, and I doubt any other [facility] further screening and/or treatment in the ED prior to admission.    Hurman HornJohn M Amisha Pospisil, MD 12/20/13 1435

## 2013-12-19 NOTE — ED Notes (Signed)
Per EMS- Pt slipped walking down the driveway to get the mail. Has shortening and rotation to right leg, c/o right hip pain. Given 2 morphine, 4 mg morphine. Has 18 gauge to right wrist. Pt is a x 4. Has hx of dementia. BP 124/62, HR 60, 99% RA.
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
## 2013-12-19 NOTE — ED Notes (Signed)
Called xray, and they report patient is next for transport.

## 2013-12-20 ENCOUNTER — Encounter (HOSPITAL_COMMUNITY)
Admission: EM | Disposition: A | Discharge status: Skilled Nursing Facility | Payer: Self-pay | Source: Home / Self Care | Attending: Internal Medicine

## 2013-12-20 ENCOUNTER — Inpatient Hospital Stay (HOSPITAL_COMMUNITY): Payer: Medicare Other

## 2013-12-20 ENCOUNTER — Encounter (HOSPITAL_COMMUNITY): Payer: Self-pay | Admitting: Anesthesiology

## 2013-12-20 ENCOUNTER — Encounter (HOSPITAL_COMMUNITY): Payer: Medicare Other | Admitting: Certified Registered"

## 2013-12-20 ENCOUNTER — Inpatient Hospital Stay (HOSPITAL_COMMUNITY): Payer: Medicare Other | Admitting: Certified Registered"

## 2013-12-20 HISTORY — PX: INTRAMEDULLARY (IM) NAIL INTERTROCHANTERIC: SHX5875

## 2013-12-20 LAB — GLUCOSE, CAPILLARY
GLUCOSE-CAPILLARY: 196 mg/dL — AB (ref 70–99)
GLUCOSE-CAPILLARY: 175 mg/dL — AB (ref 70–99)
GLUCOSE-CAPILLARY: 170 mg/dL — AB (ref 70–99)
GLUCOSE-CAPILLARY: 185 mg/dL — AB (ref 70–99)
Glucose-Capillary: 191 mg/dL — ABNORMAL HIGH (ref 70–99)
Glucose-Capillary: 143 mg/dL — ABNORMAL HIGH (ref 70–99)

## 2013-12-20 LAB — SURGICAL PCR SCREEN
MRSA, PCR: NEGATIVE
Staphylococcus aureus: POSITIVE — AB

## 2013-12-20 SURGERY — FIXATION, FRACTURE, INTERTROCHANTERIC, WITH INTRAMEDULLARY ROD
Anesthesia: General | Site: Hip | Laterality: Right

## 2013-12-20 MED ORDER — ROCURONIUM BROMIDE 100 MG/10ML IV SOLN
INTRAVENOUS | Status: DC | PRN
  Administered 2013-12-20: 25 mg via INTRAVENOUS

## 2013-12-20 MED ORDER — SODIUM CHLORIDE 0.9 % IR SOLN
Status: DC | PRN
  Administered 2013-12-20: 500 mL

## 2013-12-20 MED ORDER — PHENYLEPHRINE HCL 10 MG/ML IJ SOLN
INTRAMUSCULAR | Status: DC | PRN
  Administered 2013-12-20: 40 ug via INTRAVENOUS
  Administered 2013-12-20: 80 ug via INTRAVENOUS

## 2013-12-20 MED ORDER — SUCCINYLCHOLINE CHLORIDE 20 MG/ML IJ SOLN
INTRAMUSCULAR | Status: DC | PRN
  Administered 2013-12-20: 100 mg via INTRAVENOUS

## 2013-12-20 MED ORDER — ROCURONIUM BROMIDE 50 MG/5ML IV SOLN
INTRAVENOUS | Status: AC
  Filled 2013-12-20: qty 1

## 2013-12-20 MED ORDER — LACTATED RINGERS IV SOLN
INTRAVENOUS | Status: DC | PRN
  Administered 2013-12-20: 09:00:00 via INTRAVENOUS

## 2013-12-20 MED ORDER — HYDROMORPHONE HCL PF 1 MG/ML IJ SOLN: 0.2500 mg | INTRAMUSCULAR | Status: DC | PRN

## 2013-12-20 MED ORDER — PHENYLEPHRINE 40 MCG/ML (10ML) SYRINGE FOR IV PUSH (FOR BLOOD PRESSURE SUPPORT)
PREFILLED_SYRINGE | INTRAVENOUS | Status: AC
  Filled 2013-12-20: qty 10

## 2013-12-20 MED ORDER — FENTANYL CITRATE 0.05 MG/ML IJ SOLN
INTRAMUSCULAR | Status: DC | PRN
  Administered 2013-12-20: 50 ug via INTRAVENOUS
  Administered 2013-12-20: 100 ug via INTRAVENOUS

## 2013-12-20 MED ORDER — GLYCOPYRROLATE 0.2 MG/ML IJ SOLN
INTRAMUSCULAR | Status: AC
  Filled 2013-12-20: qty 3

## 2013-12-20 MED ORDER — FENTANYL CITRATE 0.05 MG/ML IJ SOLN
INTRAMUSCULAR | Status: AC
  Filled 2013-12-20: qty 5

## 2013-12-20 MED ORDER — INSULIN GLARGINE 100 UNIT/ML ~~LOC~~ SOLN
35.0000 [IU] | Freq: Every day | SUBCUTANEOUS | Status: DC
  Administered 2013-12-21: 35 [IU] via SUBCUTANEOUS
  Filled 2013-12-20 (×2): qty 0.35

## 2013-12-20 MED ORDER — LIDOCAINE HCL (CARDIAC) 20 MG/ML IV SOLN
INTRAVENOUS | Status: AC
  Filled 2013-12-20: qty 5

## 2013-12-20 MED ORDER — GLYCOPYRROLATE 0.2 MG/ML IJ SOLN
INTRAMUSCULAR | Status: DC | PRN
  Administered 2013-12-20: 0.6 mg via INTRAVENOUS

## 2013-12-20 MED ORDER — CLINDAMYCIN PHOSPHATE 900 MG/50ML IV SOLN
INTRAVENOUS | Status: AC
  Administered 2013-12-20: 900 mg via INTRAVENOUS
  Filled 2013-12-20: qty 50

## 2013-12-20 MED ORDER — ONDANSETRON HCL 4 MG/2ML IJ SOLN
INTRAMUSCULAR | Status: DC | PRN
  Administered 2013-12-20: 4 mg via INTRAVENOUS

## 2013-12-20 MED ORDER — NEOSTIGMINE METHYLSULFATE 1 MG/ML IJ SOLN
INTRAMUSCULAR | Status: AC
  Filled 2013-12-20: qty 10

## 2013-12-20 MED ORDER — LIDOCAINE HCL (CARDIAC) 20 MG/ML IV SOLN
INTRAVENOUS | Status: DC | PRN
  Administered 2013-12-20: 80 mg via INTRAVENOUS

## 2013-12-20 MED ORDER — PROPOFOL 10 MG/ML IV BOLUS
INTRAVENOUS | Status: AC
  Filled 2013-12-20: qty 20

## 2013-12-20 MED ORDER — NEOSTIGMINE METHYLSULFATE 1 MG/ML IJ SOLN
INTRAMUSCULAR | Status: DC | PRN
  Administered 2013-12-20: 4 mg via INTRAVENOUS

## 2013-12-20 MED ORDER — SUCCINYLCHOLINE CHLORIDE 20 MG/ML IJ SOLN
INTRAMUSCULAR | Status: AC
  Filled 2013-12-20: qty 1

## 2013-12-20 MED ORDER — PROPOFOL 10 MG/ML IV BOLUS
INTRAVENOUS | Status: DC | PRN
  Administered 2013-12-20: 120 mg via INTRAVENOUS

## 2013-12-20 MED ORDER — ONDANSETRON HCL 4 MG/2ML IJ SOLN: 4.0000 mg | Freq: Once | INTRAMUSCULAR | Status: DC | PRN

## 2013-12-20 MED ORDER — ARTIFICIAL TEARS OP OINT
TOPICAL_OINTMENT | OPHTHALMIC | Status: DC | PRN
  Administered 2013-12-20: 1 via OPHTHALMIC

## 2013-12-20 MED ORDER — SODIUM CHLORIDE 0.9 % IJ SOLN
INTRAMUSCULAR | Status: AC
  Filled 2013-12-20: qty 10

## 2013-12-20 SURGICAL SUPPLY — 34 items
BIT DRILL CANN LG 4.3MM (BIT) ×1 IMPLANT
BLADE SURG 15 STRL LF DISP TIS (BLADE) ×1 IMPLANT
BLADE SURG 15 STRL SS (BLADE) ×3
CLOTH BEACON ORANGE TIMEOUT ST (SAFETY) ×3 IMPLANT
COVER SURGICAL LIGHT HANDLE (MISCELLANEOUS) ×3 IMPLANT
COVER TABLE BACK 60X90 (DRAPES) ×3 IMPLANT
DRAPE C-ARM 42X72 X-RAY (DRAPES) ×3 IMPLANT
DRAPE STERI IOBAN 125X83 (DRAPES) ×3 IMPLANT
DRILL BIT CANN LG 4.3MM (BIT) ×3
DRSG ADAPTIC 3X8 NADH LF (GAUZE/BANDAGES/DRESSINGS) ×3 IMPLANT
DRSG MEPILEX BORDER 4X4 (GAUZE/BANDAGES/DRESSINGS) ×9 IMPLANT
DRSG MEPILEX BORDER 4X8 (GAUZE/BANDAGES/DRESSINGS) ×3 IMPLANT
ELECT REM PT RETURN 9FT ADLT (ELECTROSURGICAL) ×3
ELECTRODE REM PT RTRN 9FT ADLT (ELECTROSURGICAL) ×1 IMPLANT
EVACUATOR 1/8 PVC DRAIN (DRAIN) IMPLANT
GLOVE BIOGEL PI IND STRL 9 (GLOVE) ×1 IMPLANT
GLOVE BIOGEL PI INDICATOR 9 (GLOVE) ×2
GLOVE SURG ORTHO 9.0 STRL STRW (GLOVE) ×3 IMPLANT
GOWN PREVENTION PLUS XLARGE (GOWN DISPOSABLE) ×3 IMPLANT
GOWN SRG XL XLNG 56XLVL 4 (GOWN DISPOSABLE) ×2 IMPLANT
GOWN STRL NON-REIN XL XLG LVL4 (GOWN DISPOSABLE) ×4
GUIDEPIN 3.2X17.5 THRD DISP (PIN) ×3 IMPLANT
KIT BASIN OR (CUSTOM PROCEDURE TRAY) ×3 IMPLANT
KIT ROOM TURNOVER OR (KITS) ×3 IMPLANT
MANIFOLD NEPTUNE II (INSTRUMENTS) ×3 IMPLANT
NAIL HIP FRACT 130D 11X180 (Screw) ×3 IMPLANT
NS IRRIG 1000ML POUR BTL (IV SOLUTION) ×3 IMPLANT
PACK GENERAL/GYN (CUSTOM PROCEDURE TRAY) ×3 IMPLANT
PAD ARMBOARD 7.5X6 YLW CONV (MISCELLANEOUS) ×6 IMPLANT
SCREW BONE CORTICAL 5.0X36 (Screw) ×3 IMPLANT
SCREW LAG 10.5MMX105MM HFN (Screw) ×3 IMPLANT
STAPLER VISISTAT 35W (STAPLE) IMPLANT
SUT VIC AB 2-0 CTB1 (SUTURE) ×6 IMPLANT
WATER STERILE IRR 1000ML POUR (IV SOLUTION) ×6 IMPLANT

## 2013-12-20 NOTE — Progress Notes (Signed)
Patient able to state reason for surgery and why he fell, so consent signed by patient.

## 2013-12-20 NOTE — Op Note (Signed)
OPERATIVE REPORT  DATE OF SURGERY: 12/20/2013  PATIENT:  Brad BarcelonaJohn R Spruiell,  80 y.o. male  PRE-OPERATIVE DIAGNOSIS:  right intertrochanteric hip fracture  POST-OPERATIVE DIAGNOSIS:  intramedullary nail fixation of the right hip fracture  PROCEDURE:  Procedure(s): INTRAMEDULLARY (IM) NAIL INTERTROCHANTRIC  SURGEON:  Surgeon(s): Nadara MustardMarcus V Duda, MD  ANESTHESIA:   general  EBL:  min ML  SPECIMEN:  No Specimen  TOURNIQUET:  * No tourniquets in log *  PROCEDURE DETAILS: Patient is an 80 year old gentleman who all sustaining a right intertrochanteric hip fracture. Patient slipped on the ice. Patient presents at this time for internal fixation. Risks and benefits were discussed with the patient and the family including infection neurovascular injury failure of fixation need for additional surgery. Patient and family state they understand and wish to proceed at this time. Description of procedure patient was brought to the operating room and underwent a general anesthetic. After adequate levels of anesthesia obtained patient was [facility] the right lower extremity was placed in boot traction and the left lower extremity was placed in dorsal lithotomy position. The right hip was prepped using DuraPrep draped in the sterile field in the shower curtain was used for sterile field. A lateral incision was made just proximal to the greater trochanter. A guidewire was inserted down the femoral canal this was overdrilled and the IM nail was inserted. This was not locked proximally with 105 mm barrel locked distally with a 36 mm screw. C-arm fluoroscopy verified reduction of both AP lateral planes. The wounds were irrigated with normal saline. Subcutaneous was closed using 2-0 Vicryl the skin was closed using staples. Mepilex dressing was applied. Patient was extubated taken to the PACU in stable condition.  PLAN OF CARE: Admit to inpatient   PATIENT DISPOSITION:  PACU -  hemodynamically stable.   Nadara MustardUDA,MARCUS V, MD 12/20/2013 11:59 AM

## 2013-12-20 NOTE — Progress Notes (Signed)
Report received from PACU nurse. VSS, Pt denies pain. LR at kvo via pump

## 2013-12-20 NOTE — Progress Notes (Signed)
[facility] PROGRESS NOTE Brad Lua L. Link Snuffer, MD               Pager [phone] (if 7P to 7A, page night hospitalist on [email or web address] Klein AVW:[id number] DOB: [birth year] DOA: 12/19/2013 PCP: Ignatius Specking., MD  Assessment/Plan: 1. R hip fracture - corrected by Dr Bradd Canary on 2/21. Will follow their dispo recs. PT order to be placed in AM. SW consulted to discuss postop rehab  2. DM - resume CC diet now. Place on 35U lantus +SSI tonight. Resume home 45U lantus tomorrow if tolerating full diet  3. HTN - home meds  4. CAD - ASA 325  5. Dementia - on home meds  6. DVT ppx per ortho   Principal Problem:   Closed fracture of right hip Active Problems:   CAD (coronary artery disease)   DM2 (diabetes mellitus, type 2)   Code Status: full  Family Communication: none  Disposition Plan: likely to rehab early next week   Alysia Penna, MD  Internal Medicine Pager [phone].  If 7PM-7AM, please contact night-coverage at [email or web address], password [email or web address] 12/20/2013, 11:33 AM   LOS: 1 day   Brief narrative: Pt slipped on ice and broke R hip   Consultants:  Ortho   Procedures:  R intertrochanteric intramedullary nail placement on 2/21   Antibiotics:  None  ?  HPI/Subjective: POD 0  No complaints   Objective: Filed Vitals:   12/20/13 1020 12/20/13 1025 12/20/13 1050 12/20/13 1106  BP: 178/72 136/66 146/70 147/73  Pulse: 87 88 87 89  Temp: 97.2 F (36.2 C)  97.8 F (36.6 C) 98.2 F (36.8 C)  TempSrc:      Resp: 18 22 21 18   SpO2: 98% 98% 97% 99%    Intake/Output Summary (Last 24 hours) at 12/20/13 1133 Last data filed at 12/20/13 1020  Gross per 24 hour  Intake 1543.75 ml  Output   1275 ml  Net 268.75 ml    Exam:   General:  WM in NAD   Cardiovascular: RRR,no MRG   Respiratory: CTAB, no wheezes or rales   Abd: soft,NT,ND  Ext: no pitting edema or cyanosis   Data Reviewed: Basic Metabolic Panel:  Recent Labs Lab 12/19/13 1833  NA 131*  K 4.5   CL 95*  CO2 23  GLUCOSE 226*  BUN 22  CREATININE 0.82  CALCIUM 9.5   Liver Function Tests: No results found for this basename: AST, ALT, ALKPHOS, BILITOT, PROT, ALBUMIN,  in the last 168 hours No results found for this basename: LIPASE, AMYLASE,  in the last 168 hours No results found for this basename: AMMONIA,  in the last 168 hours CBC:  Recent Labs Lab 12/19/13 1833  WBC 13.1*  NEUTROABS 10.7*  HGB 15.0  HCT 41.8  MCV 87.6  PLT 170   Cardiac Enzymes: No results found for this basename: CKTOTAL, CKMB, CKMBINDEX, TROPONINI,  in the last 168 hours BNP: No components found with this basename: POCBNP,  CBG:  Recent Labs Lab 12/19/13 2320 12/20/13 0347 12/20/13 0806 12/20/13 1031 12/20/13 1123  GLUCAP 210* 196* 175* 170* 191*    Recent Results (from the past 240 hour(s))  SURGICAL PCR SCREEN     Status: Abnormal   Collection Time    12/19/13 11:58 PM      Result Value Ref Range Status   MRSA, PCR NEGATIVE  NEGATIVE Final   Staphylococcus aureus POSITIVE (*) NEGATIVE Final   Comment:  The Xpert SA Assay (FDA     approved for NASAL specimens     in patients over 78 years of age),     is one component of     a comprehensive surveillance     program.  Test performance has     been validated by [facility] for patients greater     than or equal to 78 year old.     It is not intended     to diagnose infection nor to     guide or monitor treatment.     RESULT CALLED TO, READ BACK BY AND VERIFIED WITH:     NOTIFIED S CRAIG,RN 12/20/13/ 0740 BY RHOLMES     Studies: Dg Chest 1 View  12/19/2013   CLINICAL DATA:  Fall, diabetes, hypertension, right hip fracture  EXAM: CHEST - 1 VIEW  COMPARISON:  12/15/2013  FINDINGS: Left subclavian 2 lead pacer noted. Normal heart size and vascularity. Mild hyperinflation without edema or pneumonia. No effusion or pneumothorax. Trachea midline. Tortuous ectatic thoracic aorta noted. Degenerative changes of the spine  with an upper thoracic scoliosis.  IMPRESSION: No superimposed acute process   Electronically Signed   By: Ruel Favorsrevor  Shick M.D.   On: 12/19/2013 19:01   Dg Hip Complete Right  12/19/2013   CLINICAL DATA:  Larey SeatFell and injured right hip.  EXAM: RIGHT HIP - COMPLETE 2+ VIEW  COMPARISON:  None.  FINDINGS: Comminuted intertrochanteric right femoral neck fracture. Right hip joint intact with moderate joint space narrowing.  Included AP pelvis demonstrates no fractures elsewhere. Contralateral left hip joint intact with symmetric moderate joint space narrowing. Sacroiliac joints and symphysis pubis intact. Mild degenerative changes involving the visualized lower lumbar spine.  IMPRESSION: Comminuted intertrochanteric right femoral neck fracture.   Electronically Signed   By: Hulan Saashomas  Lawrence M.D.   On: 12/19/2013 18:52    Scheduled Meds: . amLODipine  10 mg Oral Daily   And  . benazepril  20 mg Oral Daily  . aspirin EC  325 mg Oral Daily  . donepezil  5 mg Oral QHS  . insulin aspart  0-15 Units Subcutaneous 6 times per day  . insulin glargine  35 Units Subcutaneous QHS  . lisinopril  40 mg Oral Daily  . Memantine HCl ER  28 mg Oral Daily  . pantoprazole  80 mg Oral Daily   Continuous Infusions: . sodium chloride 75 mL/hr at 12/19/13 2313     Time Spent: 30 minutes

## 2013-12-20 NOTE — Anesthesia Procedure Notes (Signed)
Procedure Name: Intubation Date/Time: 12/20/2013 9:27 AM Performed by: Ellin GoodieWEAVER, Brad Goedken M Pre-anesthesia Checklist: Patient identified, Emergency Drugs available, Suction available, Patient being monitored and Timeout performed Patient Re-evaluated:Patient Re-evaluated prior to inductionOxygen Delivery Method: Circle system utilized Preoxygenation: Pre-oxygenation with 100% oxygen Intubation Type: IV induction Ventilation: Mask ventilation without difficulty Laryngoscope Size: Mac and 3 Grade View: Grade I Tube type: Oral Tube size: 7.5 mm Number of attempts: 1 Airway Equipment and Method: Stylet Placement Confirmation: ETT inserted through vocal cords under direct vision,  positive ETCO2 and breath sounds checked- equal and bilateral Secured at: 22 cm Dental Injury: Teeth and Oropharynx as per pre-operative assessment  Comments: Easy atraumatic induction and intubation with MAC 3 blade.  Dr. Katrinka BlazingSmith verified placement.  Carlynn HeraldH Weaver, CRNA

## 2013-12-20 NOTE — Transfer of Care (Signed)
Immediate Anesthesia Transfer of Care Note  Patient: Brad BarcelonaJohn R Klein  Procedure(s) Performed: Procedure(s): INTRAMEDULLARY (IM) NAIL INTERTROCHANTRIC (Right)  Patient Location: PACU  Anesthesia Type:General  Level of Consciousness: awake, alert  and sedated  Airway & Oxygen Therapy: Patient connected to face mask oxygen  Post-op Assessment: Report given to PACU RN  Post vital signs: stable  Complications: No apparent anesthesia complications
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
## 2013-12-20 NOTE — Anesthesia Preprocedure Evaluation (Addendum)
Anesthesia Evaluation  Patient identified by MRN, date of birth, ID band Patient awake and Patient confused    Reviewed: Allergy & Precautions, H&P , NPO status , Patient's Chart, lab work & pertinent test results, reviewed documented beta blocker date and time   Airway Mallampati: I TM Distance: >3 FB     Dental  (+) Edentulous Upper, Dental Advisory Given   Pulmonary former smoker,  breath sounds clear to auscultation        Cardiovascular hypertension, + CAD + pacemaker Rhythm:Regular     Neuro/Psych    GI/Hepatic PUD,   Endo/Other  diabetes, Type 2, Insulin Dependent  Renal/GU      Musculoskeletal   Abdominal (+)  Abdomen: soft.    Peds  Hematology   Anesthesia Other Findings Dementia  Reproductive/Obstetrics                         Anesthesia Physical Anesthesia Plan  ASA: III  Anesthesia Plan: General   Post-op Pain Management:    Induction: Intravenous  Airway Management Planned: Oral ETT  Additional Equipment:   Intra-op Plan:   Post-operative Plan: Extubation in OR  Informed Consent:   Plan Discussed with:   Anesthesia Plan Comments:         Anesthesia Quick Evaluation

## 2013-12-20 NOTE — Interval H&P Note (Signed)
History and Physical Interval Note:  12/20/2013 7:23 AM  Brad BarcelonaJohn R Shenefield  has presented today for surgery, with the diagnosis of right intertrochanteric hip fracture  The various methods of treatment have been discussed with the patient and family. After consideration of risks, benefits and other options for treatment, the patient has consented to  Procedure(s): INTRAMEDULLARY (IM) NAIL INTERTROCHANTRIC (Right) as a surgical intervention .  The patient's history has been reviewed, patient examined, no change in status, stable for surgery.  I have reviewed the patient's chart and labs.  Questions were answered to the patient's satisfaction.     Corbett Moulder V

## 2013-12-20 NOTE — Anesthesia Postprocedure Evaluation (Signed)
  Anesthesia Post-op Note  Patient: Antonietta BarcelonaJohn R Bauserman  Procedure(s) Performed: Procedure(s): INTRAMEDULLARY (IM) NAIL INTERTROCHANTRIC (Right)  Patient Location: PACU  Anesthesia Type:General  Level of Consciousness: awake, alert , oriented and patient cooperative  Airway and Oxygen Therapy: Patient Spontanous Breathing  Post-op Pain: mild  Post-op Assessment: Post-op Vital signs reviewed, Patient's Cardiovascular Status Stable, Respiratory Function Stable, Patent Airway, No signs of Nausea or vomiting and Pain level controlled  Post-op Vital Signs: stable  Complications: No apparent anesthesia complications

## 2013-12-20 NOTE — H&P (View-Only) (Signed)
Reason for Consult: Right intertrochanteric hip fracture Referring Physician: Dr. [name] is an 80 y.o. male.  HPI: Patient 80 year old gentleman with dementia who slipped on ice and sustained a right intertrochanteric hip fracture.  Past Medical History  Diagnosis Date  . Abdominal pain, other specified site   . Duodenal ulcer   . Dementia   . Diabetes mellitus without complication   . Hypertension     Past Surgical History  Procedure Laterality Date  . Esophagogastroduodenoscopy  09/28/2010    egd/tcs  . Sigmoidoscopy  02/25/2010  . Colonoscopy  01/03/1999  . Pacemaker insertion  10/2008    No family history on file.  Social History:  reports that he has quit smoking. He does not have any smokeless tobacco history on file. He reports that he does not drink alcohol. His drug history is not on file.  Allergies:  Allergies  Allergen Reactions  . Penicillins Other (See Comments)    Medications: I have reviewed the patient's current medications.  Results for orders placed during the hospital encounter of 12/19/13 (from the past 48 hour(s))  BASIC METABOLIC PANEL     Status: Abnormal   Collection Time    12/19/13  6:33 PM      Result Value Ref Range   Sodium 131 (*) 137 - 147 mEq/L   Potassium 4.5  3.7 - 5.3 mEq/L   Chloride 95 (*) 96 - 112 mEq/L   CO2 23  19 - 32 mEq/L   Glucose, Bld 226 (*) 70 - 99 mg/dL   BUN 22  6 - 23 mg/dL   Creatinine, Ser 0.82  0.50 - 1.35 mg/dL   Calcium 9.5  8.4 - 10.5 mg/dL   GFR calc non Af Amer 80 (*) >90 mL/min   GFR calc Af Amer >90  >90 mL/min   Comment: (NOTE)     The eGFR has been calculated using the CKD EPI equation.     This calculation has not been validated in all clinical situations.     eGFR's persistently <90 mL/min signify possible Chronic Kidney     Disease.  CBC WITH DIFFERENTIAL     Status: Abnormal   Collection Time    12/19/13  6:33 PM      Result Value Ref Range   WBC 13.1 (*) 4.0 - 10.5 K/uL   RBC 4.77  4.22 - 5.81 MIL/uL   Hemoglobin 15.0  13.0 - 17.0 g/dL   HCT 41.8  39.0 - 52.0 %   MCV 87.6  78.0 - 100.0 fL   MCH 31.4  26.0 - 34.0 pg   MCHC 35.9  30.0 - 36.0 g/dL   RDW 12.1  11.5 - 15.5 %   Platelets 170  150 - 400 K/uL   Neutrophils Relative % 81 (*) 43 - 77 %   Neutro Abs 10.7 (*) 1.7 - 7.7 K/uL   Lymphocytes Relative 12  12 - 46 %   Lymphs Abs 1.5  0.7 - 4.0 K/uL   Monocytes Relative 5  3 - 12 %   Monocytes Absolute 0.7  0.1 - 1.0 K/uL   Eosinophils Relative 2  0 - 5 %   Eosinophils Absolute 0.2  0.0 - 0.7 K/uL   Basophils Relative 0  0 - 1 %   Basophils Absolute 0.1  0.0 - 0.1 K/uL  PROTIME-INR     Status: None   Collection Time    12/19/13  6:33 PM  Result Value Ref Range   Prothrombin Time 14.1  11.6 - 15.2 seconds   INR 1.11  0.00 - 1.49  TYPE AND SCREEN     Status: None   Collection Time    12/19/13  6:33 PM      Result Value Ref Range   ABO/RH(D) A POS     Antibody Screen NEG     Sample Expiration 12/22/2013    ABO/RH     Status: None   Collection Time    12/19/13  6:33 PM      Result Value Ref Range   ABO/RH(D) A POS      Dg Chest 1 View  12/19/2013   CLINICAL DATA:  Fall, diabetes, hypertension, right hip fracture  EXAM: CHEST - 1 VIEW  COMPARISON:  12/15/2013  FINDINGS: Left subclavian 2 lead pacer noted. Normal heart size and vascularity. Mild hyperinflation without edema or pneumonia. No effusion or pneumothorax. Trachea midline. Tortuous ectatic thoracic aorta noted. Degenerative changes of the spine with an upper thoracic scoliosis.  IMPRESSION: No superimposed acute process   Electronically Signed   By: Daryll Brod M.D.   On: 12/19/2013 19:01   Dg Hip Complete Right  12/19/2013   CLINICAL DATA:  [place] and injured right hip.  EXAM: RIGHT HIP - COMPLETE 2+ VIEW  COMPARISON:  None.  FINDINGS: Comminuted intertrochanteric right femoral neck fracture. Right hip joint intact with moderate joint space narrowing.  Included AP pelvis demonstrates  no fractures elsewhere. Contralateral left hip joint intact with symmetric moderate joint space narrowing. Sacroiliac joints and symphysis pubis intact. Mild degenerative changes involving the visualized lower lumbar spine.  IMPRESSION: Comminuted intertrochanteric right femoral neck fracture.   Electronically Signed   By: Evangeline Dakin M.D.   On: 12/19/2013 18:52    Review of Systems  All other systems reviewed and are negative.   Blood pressure 123/65, pulse 59, temperature 98.7 F (37.1 C), temperature source Oral, resp. rate 20, SpO2 100.00%. Physical Exam On examination patient's right lower extremity is shortened and externally rotated. His foot is warm and neurovascularly intact. Radiographs shows a displaced intertrochanteric hip fracture on the right. Assessment/Plan: Assessment: Right intertrochanteric hip fracture.  Plan: We'll plan for intramedullary nail fixation of the hip fracture. Risks and benefits were discussed with the patient and the family including infection neurovascular injury DVT pulmonary embolus need for additional surgery. Patient and family state they understand and wish to proceed at this time. With patient's dementia patient may most likely not be able to ambulate again due to mental status. Anticipate patient Brad need skilled nursing at discharge.  Brad Klein V 12/19/2013, 8:20 PM

## 2013-12-21 LAB — CBC
HCT: 32.6 % — ABNORMAL LOW (ref 39.0–52.0)
Hemoglobin: 11.7 g/dL — ABNORMAL LOW (ref 13.0–17.0)
MCH: 31.8 pg (ref 26.0–34.0)
MCHC: 35.9 g/dL (ref 30.0–36.0)
MCV: 88.6 fL (ref 78.0–100.0)
Platelets: 130 10*3/uL — ABNORMAL LOW (ref 150–400)
RBC: 3.68 MIL/uL — AB (ref 4.22–5.81)
RDW: 12.2 % (ref 11.5–15.5)
WBC: 8.3 10*3/uL (ref 4.0–10.5)

## 2013-12-21 LAB — BASIC METABOLIC PANEL
BUN: 16 mg/dL (ref 6–23)
CO2: 24 meq/L (ref 19–32)
Calcium: 8.4 mg/dL (ref 8.4–10.5)
Chloride: 95 mEq/L — ABNORMAL LOW (ref 96–112)
Creatinine, Ser: 0.87 mg/dL (ref 0.50–1.35)
GFR calc Af Amer: >90 mL/min (ref >90)
GFR calc non Af Amer: 78 mL/min — ABNORMAL LOW (ref >90)
Glucose, Bld: 148 mg/dL — ABNORMAL HIGH (ref 70–99)
Potassium: 4 mEq/L (ref 3.7–5.3)
Sodium: 129 mEq/L — ABNORMAL LOW (ref 137–147)

## 2013-12-21 LAB — GLUCOSE, CAPILLARY
GLUCOSE-CAPILLARY: 235 mg/dL — AB (ref 70–99)
GLUCOSE-CAPILLARY: 218 mg/dL — AB (ref 70–99)
Glucose-Capillary: 154 mg/dL — ABNORMAL HIGH (ref 70–99)
Glucose-Capillary: 203 mg/dL — ABNORMAL HIGH (ref 70–99)
Glucose-Capillary: 278 mg/dL — ABNORMAL HIGH (ref 70–99)

## 2013-12-21 MED ORDER — INSULIN ASPART 100 UNIT/ML ~~LOC~~ SOLN
0.0000 [IU] | Freq: Three times a day (TID) | SUBCUTANEOUS | Status: DC
  Administered 2013-12-21: 5 [IU] via SUBCUTANEOUS
  Administered 2013-12-21: 8 [IU] via SUBCUTANEOUS
  Administered 2013-12-22: 3 [IU] via SUBCUTANEOUS
  Administered 2013-12-22: 8 [IU] via SUBCUTANEOUS
  Administered 2013-12-22: 3 [IU] via SUBCUTANEOUS
  Administered 2013-12-23: 2 [IU] via SUBCUTANEOUS
  Administered 2013-12-23: 3 [IU] via SUBCUTANEOUS

## 2013-12-21 MED ORDER — HYDROMORPHONE HCL PF 1 MG/ML IJ SOLN
0.5000 mg | INTRAMUSCULAR | Status: DC | PRN
  Administered 2013-12-21 (×2): 0.5 mg via INTRAVENOUS
  Filled 2013-12-21 (×2): qty 1

## 2013-12-21 MED ORDER — WHITE PETROLATUM GEL
Status: AC
  Administered 2013-12-21: 18:00:00
  Filled 2013-12-21: qty 5

## 2013-12-21 MED ORDER — INSULIN GLARGINE 100 UNIT/ML ~~LOC~~ SOLN
45.0000 [IU] | Freq: Every day | SUBCUTANEOUS | Status: DC
  Administered 2013-12-21 – 2013-12-22 (×2): 45 [IU] via SUBCUTANEOUS
  Filled 2013-12-21 (×3): qty 0.45
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
## 2013-12-21 NOTE — Progress Notes (Signed)
Patient and wife discuss post hospital care and different rehab facilities with case manager/social worker. Wife states she will attempt to find [place], otherwise she would [facility] for [facility].

## 2013-12-21 NOTE — Progress Notes (Addendum)
Clinical Social Work Department CLINICAL SOCIAL WORK PLACEMENT NOTE 12/21/2013  Patient:  Brad BarcelonaAYLOR,[name]  Account Number:  [id number] Admit date:  12/19/2013  Clinical Social Worker:  Jetta LoutBAILEY MORGAN, Theresia MajorsLCSWA  Date/time:  12/21/2013 07:01 PM  Clinical Social Work is seeking post-discharge placement for this patient at the following level of care:   SKILLED NURSING   (*CSW will update this form in Epic as items are completed)   12/21/2013  Patient/family provided with [facility] Department of Clinical Social Work's list of facilities offering this level of care within the geographic area requested by the patient (or if unable, by the patient's family).  12/21/2013  Patient/family informed of their freedom to choose among providers that offer the needed level of care, that participate in Medicare, Medicaid or managed care program needed by the patient, have an available bed and are willing to accept the patient.  12/21/2013  Patient/family informed of [facility]' ownership interest in [facility], as well as of the fact that they are under no obligation to receive care at this facility.  PASARR submitted to EDS on 12/21/2013 PASARR number received from E[employer] on 12/21/2013  FL2 transmitted to all facilities in geographic area requested by pt/family on  12/21/2013 FL2 transmitted to all facilities within larger geographic area on   Patient informed that his/her managed care company has contracts with or will negotiate with  certain facilities, including the following:     Patient/family informed of bed offers received:  2.23.15 [facility] Physician recommends and patient chooses bed at    Patient to be transferred to  on  12/23/2013 Patient [facility] be [facility]  The following physician request were entered in Epic:   Additional Comments:

## 2013-12-21 NOTE — Progress Notes (Addendum)
[facility] PROGRESS NOTE Shandell Jallow L. Link SnufferHolwerda, MD               Pager [phone] (if 7P to 7A, page night hospitalist on [email or web address])  Ameir R Jon AVW:[id number] DOB: 12/03/1928 DOA: 12/19/2013 PCP: Ignatius SpeckingVYAS,DHRUV B., MD  Assessment/Plan: 1. R hip fracture - corrected by Dr Bradd Canaryudda on 2/21. Will follow their dispo recs. PTordered. SW consulted to discuss postop rehab  2. DM - CC diet. Resuming home lantus dose based on SSI need overnight. Cont SSI qACHS 3. HTN - home meds  4. CAD - ASA 325  5. Dementia - on home meds  6. DVT ppx per ortho   Principal Problem:   Closed fracture of right hip Active Problems:   CAD (coronary artery disease)   DM2 (diabetes mellitus, type 2)   Code Status: full  Family Communication: none  Disposition Plan: likely to rehab early next week   Alysia PennaScott Yaire Kreher, MD  Internal Medicine Pager [phone].  If 7PM-7AM, please contact night-coverage at [email or web address], password [email or web address] 12/21/2013, 10:19 AM   LOS: 2 days   Brief narrative: Pt slipped on ice and broke R hip   Consultants:  Ortho   Procedures:  R intertrochanteric intramedullary nail placement on 2/21   Antibiotics:  None  ?  HPI/Subjective: POD 1 Working w/ PT this AM  Pain management meds increased  Foley in place  Sating 95% on RA after PT   Objective: Filed Vitals:   12/21/13 0005 12/21/13 0227 12/21/13 0344 12/21/13 0501  BP: 100/55 106/63  120/49  Pulse: 64 74  65  Temp:  98.2 F (36.8 C)  98.3 F (36.8 C)  TempSrc:  Oral  Oral  Resp:  18 18 18   SpO2:  100% 100% 100%    Intake/Output Summary (Last 24 hours) at 12/21/13 1019 Last data filed at 12/21/13 0502  Gross per 24 hour  Intake    460 ml  Output   1025 ml  Net   -565 ml    Exam:   General:  WM in NAD   Cardiovascular: RRR,no MRG   Respiratory: CTAB, no wheezes or rales   Abd: soft,NT,ND  Ext: no edema or cyanosis  Data Reviewed: Basic Metabolic Panel:  Recent Labs Lab 12/19/13 1833  12/21/13 0545  NA 131* 129*  K 4.5 4.0  CL 95* 95*  CO2 23 24  GLUCOSE 226* 148*  BUN 22 16  CREATININE 0.82 0.87  CALCIUM 9.5 8.4   Liver Function Tests: No results found for this basename: AST, ALT, ALKPHOS, BILITOT, PROT, ALBUMIN,  in the last 168 hours No results found for this basename: LIPASE, AMYLASE,  in the last 168 hours No results found for this basename: AMMONIA,  in the last 168 hours CBC:  Recent Labs Lab 12/19/13 1833 12/21/13 0545  WBC 13.1* 8.3  NEUTROABS 10.7*  --   HGB 15.0 11.7*  HCT 41.8 32.6*  MCV 87.6 88.6  PLT 170 130*   Cardiac Enzymes: No results found for this basename: CKTOTAL, CKMB, CKMBINDEX, TROPONINI,  in the last 168 hours BNP: No components found with this basename: POCBNP,  CBG:  Recent Labs Lab 12/20/13 1123 12/20/13 1613 12/20/13 1922 12/21/13 0003 12/21/13 0503  GLUCAP 191* 143* 185* 203* 154*    Recent Results (from the past 240 hour(s))  SURGICAL PCR SCREEN     Status: Abnormal   Collection Time    12/19/13 11:58 PM      Result Value Ref Range Status  MRSA, PCR NEGATIVE  NEGATIVE Final   Staphylococcus aureus POSITIVE (*) NEGATIVE Final   Comment:            The Xpert SA Assay (FDA     approved for NASAL specimens     in patients over 76 years of age),     is one component of     a comprehensive surveillance     program.  Test performance has     been validated by [employer] for patients greater     than or equal to 98 year old.     It is not intended     to diagnose infection nor to     guide or monitor treatment.     RESULT CALLED TO, READ BACK BY AND VERIFIED WITH:     NOTIFIED S CRAIG,RN 12/20/13/ 0740 BY RHOLMES     Studies: Dg Chest 1 View  12/19/2013   CLINICAL DATA:  Fall, diabetes, hypertension, right hip fracture  EXAM: CHEST - 1 VIEW  COMPARISON:  12/15/2013  FINDINGS: Left subclavian 2 lead pacer noted. Normal heart size and vascularity. Mild hyperinflation without edema or pneumonia. No  effusion or pneumothorax. Trachea midline. Tortuous ectatic thoracic aorta noted. Degenerative changes of the spine with an upper thoracic scoliosis.  IMPRESSION: No superimposed acute process   Electronically Signed   By: Ruel Favors M.D.   On: 12/19/2013 19:01   Dg Hip Complete Right  12/19/2013   CLINICAL DATA:  Larey Seat and injured right hip.  EXAM: RIGHT HIP - COMPLETE 2+ VIEW  COMPARISON:  None.  FINDINGS: Comminuted intertrochanteric right femoral neck fracture. Right hip joint intact with moderate joint space narrowing.  Included AP pelvis demonstrates no fractures elsewhere. Contralateral left hip joint intact with symmetric moderate joint space narrowing. Sacroiliac joints and symphysis pubis intact. Mild degenerative changes involving the visualized lower lumbar spine.  IMPRESSION: Comminuted intertrochanteric right femoral neck fracture.   Electronically Signed   By: Hulan Saas M.D.   On: 12/19/2013 18:52    Scheduled Meds: . amLODipine  10 mg Oral Daily   And  . benazepril  20 mg Oral Daily  . aspirin EC  325 mg Oral Daily  . donepezil  5 mg Oral QHS  . insulin aspart  0-15 Units Subcutaneous TID WC  . insulin glargine  35 Units Subcutaneous QHS  . lisinopril  40 mg Oral Daily  . Memantine HCl ER  28 mg Oral Daily  . pantoprazole  80 mg Oral Daily   Continuous Infusions: . sodium chloride 75 mL/hr at 12/19/13 2313     Time Spent: 30 minutes

## 2013-12-21 NOTE — Progress Notes (Signed)
Subjective: 1 Day Post-Op Procedure(s) (LRB): INTRAMEDULLARY (IM) NAIL INTERTROCHANTRIC (Right) Patient reports pain as moderate.  Only a slight acute blood loss anemia from the fracture/surgery thus far.  Awake and alert.  Objective: Vital signs in last 24 hours: Temp:  [97.2 F (36.2 C)-98.3 F (36.8 C)] 98.3 F (36.8 C) (02/22 0501) Pulse Rate:  [61-89] 65 (02/22 0501) Resp:  [17-22] 18 (02/22 0501) BP: (100-178)/(49-77) 120/49 mmHg (02/22 0501) SpO2:  [97 %-100 %] 100 % (02/22 0501)  Intake/Output from previous day: 02/21 0701 - 02/22 0700 In: 1903.8 [P.O.:410; I.V.:1493.8] Out: 2225 [Urine:2125; Blood:100] Intake/Output this shift:     Recent Labs  12/19/13 1833 12/21/13 0545  HGB 15.0 11.7*    Recent Labs  12/19/13 1833 12/21/13 0545  WBC 13.1* 8.3  RBC 4.77 3.68*  HCT 41.8 32.6*  PLT 170 130*    Recent Labs  12/19/13 1833 12/21/13 0545  NA 131* 129*  K 4.5 4.0  CL 95* 95*  CO2 23 24  BUN 22 16  CREATININE 0.82 0.87  GLUCOSE 226* 148*  CALCIUM 9.5 8.4    Recent Labs  12/19/13 1833  INR 1.11    Sensation intact distally Intact pulses distally Dorsiflexion/Plantar flexion intact Incision: scant drainage  Assessment/Plan: 1 Day Post-Op Procedure(s) (LRB): INTRAMEDULLARY (IM) NAIL INTERTROCHANTRIC (Right) Up with therapy,  Needs PT/OT for mobility and ADL's; full weight as tolerated right hip.  Kathryne HitchBLACKMAN,Akiya Morr Y 12/21/2013, 7:39 AM

## 2013-12-21 NOTE — Progress Notes (Signed)
Clinical Social Work Department BRIEF PSYCHOSOCIAL ASSESSMENT 12/21/2013  Patient:  Brad Klein, Brad Klein     Account Number:  [id number]     Admit date:  12/19/2013  Clinical Social Worker:  Rolinda Roan  Date/Time:  12/21/2013 06:55 PM  Referred by:  Physician  Date Referred:  12/19/2013 Referred for  SNF Placement   Other Referral:   Interview type:  Family Other interview type:    PSYCHOSOCIAL DATA Living Status:  WIFE Admitted from facility:   Level of care:   Primary support name:  Contrell Ballentine and Haze Boyden Primary support relationship to patient:  SPOUSE Degree of support available:   Doroteo Bradford is patient's wife and Haze Boyden is patient's grandson. They are both very supportive and involved in patient's care.    CURRENT CONCERNS  Other Concerns:    SOCIAL WORK ASSESSMENT / PLAN Clinical Social Worker (CSW) met with wife at bedside. Wife reported that she would like for patient to go to the [facility] in [place] or [place]. CSW explained to wife the PT is recommending CIR. Wife would like for CIR to evaluate patient for admission and CSW to initiate SNF search in [place].    CSW spoke with grandson Haze Boyden on the phone at wife's request. CSW explained SNF placement process and the difference between CIR and SNF. Haze Boyden verbalized his understanding.   Assessment/plan status:  Psychosocial Support/Ongoing Assessment of Needs Other assessment/ plan:   Information/referral to community resources:   CSW gave wife SNF list.    PATIENT'S/FAMILY'S RESPONSE TO PLAN OF CARE: Wife and grandson thanked CSW for starting placement process.

## 2013-12-21 NOTE — Progress Notes (Signed)
Dr Link SnufferHolwerda phoned for breakthrough pain control and changing CBG's to achs d/t toleration of meals at present. New orders received.

## 2013-12-22 ENCOUNTER — Encounter (HOSPITAL_COMMUNITY): Payer: Self-pay | Admitting: General Practice

## 2013-12-22 DIAGNOSIS — S72143A Displaced intertrochanteric fracture of unspecified femur, initial encounter for closed fracture: Secondary | ICD-10-CM

## 2013-12-22 DIAGNOSIS — W19XXXA Unspecified fall, initial encounter: Secondary | ICD-10-CM

## 2013-12-22 LAB — CBC
HCT: 31.8 % — ABNORMAL LOW (ref 39.0–52.0)
Hemoglobin: 11.2 g/dL — ABNORMAL LOW (ref 13.0–17.0)
MCH: 31.5 pg (ref 26.0–34.0)
MCHC: 35.2 g/dL (ref 30.0–36.0)
MCV: 89.6 fL (ref 78.0–100.0)
PLATELETS: 122 10*3/uL — AB (ref 150–400)
RBC: 3.55 MIL/uL — ABNORMAL LOW (ref 4.22–5.81)
RDW: 12.1 % (ref 11.5–15.5)
WBC: 7.6 10*3/uL (ref 4.0–10.5)

## 2013-12-22 LAB — BASIC METABOLIC PANEL
BUN: 14 mg/dL (ref 6–23)
CHLORIDE: 100 meq/L (ref 96–112)
CO2: 24 meq/L (ref 19–32)
Calcium: 8.4 mg/dL (ref 8.4–10.5)
Creatinine, Ser: 0.86 mg/dL (ref 0.50–1.35)
GFR calc Af Amer: >90 mL/min (ref >90)
GFR calc non Af Amer: 79 mL/min — ABNORMAL LOW (ref >90)
Glucose, Bld: 159 mg/dL — ABNORMAL HIGH (ref 70–99)
Potassium: 4.2 mEq/L (ref 3.7–5.3)
SODIUM: 135 meq/L — AB (ref 137–147)

## 2013-12-22 LAB — GLUCOSE, CAPILLARY
GLUCOSE-CAPILLARY: 170 mg/dL — AB (ref 70–99)
GLUCOSE-CAPILLARY: 274 mg/dL — AB (ref 70–99)
Glucose-Capillary: 174 mg/dL — ABNORMAL HIGH (ref 70–99)
Glucose-Capillary: 204 mg/dL — ABNORMAL HIGH (ref 70–99)

## 2013-12-22 MED ORDER — TRAMADOL HCL 50 MG PO TABS: 50.0000 mg | ORAL_TABLET | Freq: Four times a day (QID) | ORAL | Status: AC | PRN

## 2013-12-22 NOTE — Care Management Note (Signed)
CARE MANAGEMENT NOTE 12/22/2013  Patient:  Antonietta BarcelonaAYLOR,Cecil R   Account Number:  [id number]  Date Initiated:  12/22/2013  Documentation initiated by:  Vance PeperBRADY,Zafira Munos  Subjective/Objective Assessment:   80 yr old male s/p right hip IM nailing.     Action/Plan:   PT/OT to eval, patient wants to go to rehab closer to home. CM notified Child psychotherapistocial worker.   Anticipated DC Date:     Anticipated DC Plan:  SKILLED NURSING FACILITY  In-house referral  Clinical Social Worker      DC Planning Services  CM consult      Choice offered to / List presented to:             Status of service:  In process, will continue to follow Medicare Important Message given?   (If response is "NO", the following Medicare IM given date fields will be blank) Date Medicare IM given:   Date Additional Medicare IM given:    Discharge Disposition:    Per UR Regulation:    If discussed at Long Length of Stay Meetings, dates discussed:    Comments:

## 2013-12-22 NOTE — Progress Notes (Signed)
OT Cancellation Note  Patient Details Name: Brad BarcelonaJohn R Blauvelt MRN: [id number] DOB: 11/19/1928   Cancelled Treatment:    Reason Eval/Treat Not Completed: Other (comment) Pt is Medicare and is going SNF, so OT will defer our eval to that facility as they deem appropriate. If OT eval is needed please call [phone] or page using [email or web address] (automatically changes to [facility]) using the all OT pager number [phone].   Evette GeorgesLeonard, Sky Borboa Eva [phone] 12/22/2013, 1:48 PM

## 2013-12-22 NOTE — Consult Note (Signed)
Physical Medicine and Rehabilitation Consult  Reason for Consult:  Left hip fracture Referring Physician: Dr. Benjamine Mola  HPI: Brad Klein is a 80 y.o. male with history of DM, HTN, dementia, who slipped and fell on ice on 12/19/13 with onset of right hip pain with inability to walk as well as right hip deformity. He was evaluated by Dr. Lajoyce Corners and underwent L-IT IM nailing on 12/20/13. Post op WBAT and on ASA for DVT prophylaxis.  Therapies initiated and patient limited by pain. Noted to have ABLA as well as hyponatremia. MD, PT recommending CIR for progression.     Review of Systems  Respiratory: Negative for cough.   Cardiovascular: Negative for chest pain.  Musculoskeletal: Positive for back pain, falls and joint pain.  Skin: Negative for rash.  Neurological: Positive for focal weakness.    Past Medical History  Diagnosis Date  . Abdominal pain, other specified site   . Duodenal ulcer   . Dementia   . Diabetes mellitus without complication   . Hypertension    Past Surgical History  Procedure Laterality Date  . Esophagogastroduodenoscopy  09/28/2010    egd/tcs  . Sigmoidoscopy  02/25/2010  . Colonoscopy  01/03/1999  . Pacemaker insertion  10/2008   No family history on file.  Social History:   Married. Per reports that he has quit smoking. He does not have any smokeless tobacco history on file. Per  reports that he does not drink alcohol. His drug history is not on file.   Allergies  Allergen Reactions  . Penicillins Other (See Comments)    Medications Prior to Admission  Medication Sig Dispense Refill  . amLODipine-benazepril (LOTREL) 10-20 MG per capsule Take 1 capsule by mouth daily.        Marland Kitchen aspirin 81 MG tablet Take 81 mg by mouth daily.        Marland Kitchen dexlansoprazole (DEXILANT) 60 MG capsule Take 60 mg by mouth daily.      Marland Kitchen donepezil (ARICEPT) 5 MG tablet Take 5 mg by mouth at bedtime.      Marland Kitchen glyBURIDE (DIABETA) 5 MG tablet Take 10 mg by mouth 2 (two) times  daily with a meal.      . insulin aspart (NOVOLOG) 100 UNIT/ML injection Inject 20 Units into the skin 3 (three) times daily before meals.       . insulin glargine (LANTUS) 100 UNIT/ML injection Inject 45 Units into the skin daily.      Marland Kitchen lisinopril (PRINIVIL,ZESTRIL) 40 MG tablet Take 40 mg by mouth daily.        . Memantine HCl ER (NAMENDA XR) 28 MG CP24 Take 28 mg by mouth daily.      Marland Kitchen omeprazole (PRILOSEC) 20 MG capsule Take 20 mg by mouth daily.          Home: Home Living Family/patient expects to be discharged to:: Inpatient rehab  Functional History: Prior Function Comments: Supervision as he has h/o dementia Functional Status:  Mobility:     Ambulation/Gait Ambulation Distance (Feet): 2 Feet General Gait Details: Cues for gait sequence and to press down into RW to unweigh painful RLE for LLE advancement    ADL:    Cognition: Cognition Overall Cognitive Status: History of cognitive impairments - at baseline Orientation Level: Oriented to person;Oriented to place Cognition Arousal/Alertness: Awake/alert Behavior During Therapy: WFL for tasks assessed/performed Overall Cognitive Status: History of cognitive impairments - at baseline Memory: Decreased short-term memory  Blood pressure 138/58, pulse  80, temperature 98.2 F (36.8 C), temperature source Oral, resp. rate 18, SpO2 96.00%. Physical Exam  Constitutional: He appears well-developed and well-nourished.  Big framed man.  HENT:  Head: Normocephalic and atraumatic.  Eyes: Conjunctivae and EOM are normal. Pupils are equal, round, and reactive to light.  Neck: No JVD present. No tracheal deviation present. No thyromegaly present.  Cardiovascular: Normal rate.   Respiratory: No respiratory distress.  GI: He exhibits no distension.  Musculoskeletal:  Right hip dressed. Tender.  Neurological:  UE's 5/5 . LLE 3/5 prox to 5/5 at ankle. Able to bend right ankle without difficulty.  Psychiatric: He has a normal  mood and affect. His behavior is normal. Thought content normal.    Results for orders placed during the hospital encounter of 12/19/13 (from the past 24 hour(s))  GLUCOSE, CAPILLARY     Status: Abnormal   Collection Time    12/21/13 11:07 AM      Result Value Ref Range   Glucose-Capillary 278 (*) 70 - 99 mg/dL  GLUCOSE, CAPILLARY     Status: Abnormal   Collection Time    12/21/13  4:06 PM      Result Value Ref Range   Glucose-Capillary 235 (*) 70 - 99 mg/dL   Comment 1 Notify RN     Comment 2 Documented in Chart    GLUCOSE, CAPILLARY     Status: Abnormal   Collection Time    12/21/13  9:43 PM      Result Value Ref Range   Glucose-Capillary 218 (*) 70 - 99 mg/dL   Comment 1 Notify RN    CBC     Status: Abnormal   Collection Time    12/22/13  5:40 AM      Result Value Ref Range   WBC 7.6  4.0 - 10.5 K/uL   RBC 3.55 (*) 4.22 - 5.81 MIL/uL   Hemoglobin 11.2 (*) 13.0 - 17.0 g/dL   HCT 09.831.8 (*) 11.939.0 - 14.752.0 %   MCV 89.6  78.0 - 100.0 fL   MCH 31.5  26.0 - 34.0 pg   MCHC 35.2  30.0 - 36.0 g/dL   RDW 82.912.1  56.211.5 - 13.015.5 %   Platelets 122 (*) 150 - 400 K/uL  BASIC METABOLIC PANEL     Status: Abnormal   Collection Time    12/22/13  5:40 AM      Result Value Ref Range   Sodium 135 (*) 137 - 147 mEq/L   Potassium 4.2  3.7 - 5.3 mEq/L   Chloride 100  96 - 112 mEq/L   CO2 24  19 - 32 mEq/L   Glucose, Bld 159 (*) 70 - 99 mg/dL   BUN 14  6 - 23 mg/dL   Creatinine, Ser 8.650.86  0.50 - 1.35 mg/dL   Calcium 8.4  8.4 - 78.410.5 mg/dL   GFR calc non Af Amer 79 (*) >90 mL/min   GFR calc Af Amer >90  >90 mL/min  GLUCOSE, CAPILLARY     Status: Abnormal   Collection Time    12/22/13  6:50 AM      Result Value Ref Range   Glucose-Capillary 174 (*) 70 - 99 mg/dL   Dg Hip Operative Right  12/20/2013   CLINICAL DATA:  Fracture  EXAM: DG OPERATIVE RIGHT HIP  COMPARISON:  None.  FINDINGS: Dynamic compression screw and intra medullary rod are seen transfixing an intertrochanteric femur fracture.  Anatomic alignment. No breakage or loosening of the hardware.  IMPRESSION: ORIF intertrochanteric right femur fracture.   Electronically Signed   By: Maryclare Bean M.D.   On: 12/20/2013 14:00    Assessment/Plan: Diagnosis: Right intertrochanteric hip fx 1. Does the need for close, 24 hr/day medical supervision in concert with the patient's rehab needs make it unreasonable for this patient to be served in a less intensive setting? Yes 2. Co-Morbidities requiring supervision/potential complications: cad, dm, abla 3. Due to bladder management, bowel management, safety, skin/wound care, disease management, medication administration, pain management and patient education, does the patient require 24 hr/day rehab nursing? Yes 4. Does the patient require coordinated care of a physician, rehab nurse, PT (1-2 hrs/day, 5 days/week) and OT (1-2 hrs/day, 5 days/week) to address physical and functional deficits in the context of the above medical diagnosis(es)? Yes Addressing deficits in the following areas: balance, endurance, locomotion, strength, transferring, bowel/bladder control, bathing, dressing, feeding, grooming and toileting 5. Can the patient actively participate in an intensive therapy program of at least 3 hrs of therapy per day at least 5 days per week? Yes 6. The potential for patient to make measurable gains while on inpatient rehab is excellent 7. Anticipated functional outcomes upon discharge from inpatient rehab are mod I to supervision with PT, mod I to min assist with OT, n/a with SLP. 8. Estimated rehab length of stay to reach the above functional goals is: 12-15 days 9. Does the patient have adequate social supports to accommodate these discharge functional goals? Yes and Potentially 10. Anticipated D/C setting: Home 11. Anticipated post D/C treatments: HH therapy 12. Overall Rehab/Functional Prognosis: good  RECOMMENDATIONS: This patient's condition is appropriate for continued  rehabilitative care in the following setting: CIR Patient has agreed to participate in recommended program. Potentially Note that insurance prior authorization may be required for reimbursement for recommended care.  Comment: Pt prefers to go to rehab closer to home in [place]. Understand the benefits of CIR however. He would like to think about it. Therapy to come re-eval today. Rehab Admissions Coordinator to follow up.  Thanks,  Ranelle Oyster, MD, [place]     12/22/2013

## 2013-12-22 NOTE — Progress Notes (Signed)
I met with pt, his wife, and grandson at bedside. All are requesting [facility] SNF per grandson, who is a SW, is requesting. I have alerted RNCM . We will sign off. [phone]

## 2013-12-22 NOTE — Progress Notes (Signed)
Utilization review complete 

## 2013-12-22 NOTE — Progress Notes (Signed)
PROGRESS NOTE   LANDERS PRAJAPATI ZOX:[id number] DOB: May 16, 1929 DOA: 12/19/2013 PCP: Ignatius Specking., MD  Assessment/Plan: R hip fracture - corrected by Dr Bradd Canary on 2/21. PT recommended CIR- consult placed  DM - CC diet. Resuming home lantus dose based on SSI need overnight. Cont SSI qACHS  HTN - home meds   CAD - ASA 325   Dementia - on home meds   DVT ppx per ortho   Principal Problem:   Closed fracture of right hip Active Problems:   CAD (coronary artery disease)   DM2 (diabetes mellitus, type 2)   Code Status: full  Family Communication: none  Disposition Plan: likely to rehab early next week   Marlin Canary DO Internal Medicine Pager [phone]  If 7PM-7AM, please contact night-coverage at [email or web address], password [facility] 12/22/2013, 8:50 AM   LOS: 3 days   Brief narrative: Pt slipped on ice and broke R hip   Consultants:  Ortho   Procedures:  R intertrochanteric intramedullary nail placement on 2/21   Antibiotics:  None    HPI/Subjective: Foley d/c'd Pain controlled  Objective: Filed Vitals:   12/21/13 1416 12/21/13 1547 12/21/13 2141 12/22/13 0625  BP: 125/53  138/69 138/58  Pulse: 64  85 80  Temp: 98.4 F (36.9 C)  99.5 F (37.5 C) 98.2 F (36.8 C)  TempSrc:   Oral Oral  Resp: 18 16 18 18   SpO2: 95% 100% 96% 96%    Intake/Output Summary (Last 24 hours) at 12/22/13 0850 Last data filed at 12/22/13 8295  Gross per 24 hour  Intake   1995 ml  Output   2725 ml  Net   -730 ml    Exam:   General:  WM in NAD   Cardiovascular: RRR,no MRG   Respiratory: CTAB, no wheezes or rales   Abd: soft,NT,ND  Ext: no edema or cyanosis  Data Reviewed: Basic Metabolic Panel:  Recent Labs Lab 12/19/13 1833 12/21/13 0545 12/22/13 0540  NA 131* 129* 135*  K 4.5 4.0 4.2  CL 95* 95* 100  CO2 23 24 24   GLUCOSE 226* 148* 159*  BUN 22 16 14   CREATININE 0.82 0.87 0.86  CALCIUM 9.5 8.4 8.4   Liver Function Tests: No results found for this  basename: AST, ALT, ALKPHOS, BILITOT, PROT, ALBUMIN,  in the last 168 hours No results found for this basename: LIPASE, AMYLASE,  in the last 168 hours No results found for this basename: AMMONIA,  in the last 168 hours CBC:  Recent Labs Lab 12/19/13 1833 12/21/13 0545 12/22/13 0540  WBC 13.1* 8.3 7.6  NEUTROABS 10.7*  --   --   HGB 15.0 11.7* 11.2*  HCT 41.8 32.6* 31.8*  MCV 87.6 88.6 89.6  PLT 170 130* 122*   Cardiac Enzymes: No results found for this basename: CKTOTAL, CKMB, CKMBINDEX, TROPONINI,  in the last 168 hours BNP: No components found with this basename: POCBNP,  CBG:  Recent Labs Lab 12/21/13 0503 12/21/13 1107 12/21/13 1606 12/21/13 2143 12/22/13 0650  GLUCAP 154* 278* 235* 218* 174*    Recent Results (from the past 240 hour(s))  SURGICAL PCR SCREEN     Status: Abnormal   Collection Time    12/19/13 11:58 PM      Result Value Ref Range Status   MRSA, PCR NEGATIVE  NEGATIVE Final   Staphylococcus aureus POSITIVE (*) NEGATIVE Final   Comment:            The Xpert SA Assay (FDA  approved for NASAL specimens     in patients over 78 years of age),     is one component of     a comprehensive surveillance     program.  Test performance has     been validated by [facility] for patients greater     than or equal to 78 year old.     It is not intended     to diagnose infection nor to     guide or monitor treatment.     RESULT CALLED TO, READ BACK BY AND VERIFIED WITH:     NOTIFIED S CRAIG,RN 12/20/13/ 0740 BY RHOLMES     Studies: Dg Chest 1 View  12/19/2013   CLINICAL DATA:  Fall, diabetes, hypertension, right hip fracture  EXAM: CHEST - 1 VIEW  COMPARISON:  12/15/2013  FINDINGS: Left subclavian 2 lead pacer noted. Normal heart size and vascularity. Mild hyperinflation without edema or pneumonia. No effusion or pneumothorax. Trachea midline. Tortuous ectatic thoracic aorta noted. Degenerative changes of the spine with an upper thoracic scoliosis.   IMPRESSION: No superimposed acute process   Electronically Signed   By: Ruel Favorsrevor  Shick M.D.   On: 12/19/2013 19:01   Dg Hip Complete Right  12/19/2013   CLINICAL DATA:  Larey SeatFell and injured right hip.  EXAM: RIGHT HIP - COMPLETE 2+ VIEW  COMPARISON:  None.  FINDINGS: Comminuted intertrochanteric right femoral neck fracture. Right hip joint intact with moderate joint space narrowing.  Included AP pelvis demonstrates no fractures elsewhere. Contralateral left hip joint intact with symmetric moderate joint space narrowing. Sacroiliac joints and symphysis pubis intact. Mild degenerative changes involving the visualized lower lumbar spine.  IMPRESSION: Comminuted intertrochanteric right femoral neck fracture.   Electronically Signed   By: Hulan Saashomas  Lawrence M.D.   On: 12/19/2013 18:52    Scheduled Meds: . amLODipine  10 mg Oral Daily   And  . benazepril  20 mg Oral Daily  . aspirin EC  325 mg Oral Daily  . donepezil  5 mg Oral QHS  . insulin aspart  0-15 Units Subcutaneous TID WC  . insulin glargine  45 Units Subcutaneous QHS  . lisinopril  40 mg Oral Daily  . Memantine HCl ER  28 mg Oral Daily  . pantoprazole  80 mg Oral Daily   Continuous Infusions: . sodium chloride 75 mL/hr at 12/21/13 2014     Time Spent: 30 minutes

## 2013-12-22 NOTE — Progress Notes (Signed)
Patient ID: Brad Klein, male   DOB: 09/24/1929, 80 y.o.   MRN: [id number] Patient comfortable this morning without complaints. Plan for discharge to skilled nursing.

## 2013-12-22 NOTE — Progress Notes (Signed)
[facility] and can be discharged when medically stable.   Sabino NiemannAmy Doryce Mcgregory, MSW, [employer]CSWA [phone]

## 2013-12-22 NOTE — Progress Notes (Signed)
Rehab Admissions Coordinator Note:  Patient was screened by Trish MageLogue, Kaydon Husby M for appropriateness for an Inpatient Acute Rehab Consult.  At this time, an inpatient rehab consult has been ordered and is pending completion.  Trish MageLogue, Leverne Tessler M 12/22/2013, 8:48 AM  I can be reached at [phone].
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
## 2013-12-23 LAB — CBC
HCT: 28.6 % — ABNORMAL LOW (ref 39.0–52.0)
HEMOGLOBIN: 10.3 g/dL — AB (ref 13.0–17.0)
MCH: 32.1 pg (ref 26.0–34.0)
MCHC: 36 g/dL (ref 30.0–36.0)
MCV: 89.1 fL (ref 78.0–100.0)
Platelets: 123 10*3/uL — ABNORMAL LOW (ref 150–400)
RBC: 3.21 MIL/uL — ABNORMAL LOW (ref 4.22–5.81)
RDW: 12 % (ref 11.5–15.5)
WBC: 6.3 10*3/uL (ref 4.0–10.5)

## 2013-12-23 LAB — BASIC METABOLIC PANEL
BUN: 13 mg/dL (ref 6–23)
CO2: 24 meq/L (ref 19–32)
Calcium: 8.5 mg/dL (ref 8.4–10.5)
Chloride: 98 mEq/L (ref 96–112)
Creatinine, Ser: 0.81 mg/dL (ref 0.50–1.35)
GFR calc Af Amer: >90 mL/min (ref >90)
GFR calc non Af Amer: 81 mL/min — ABNORMAL LOW (ref >90)
GLUCOSE: 146 mg/dL — AB (ref 70–99)
POTASSIUM: 4 meq/L (ref 3.7–5.3)
SODIUM: 135 meq/L — AB (ref 137–147)

## 2013-12-23 LAB — GLUCOSE, CAPILLARY
GLUCOSE-CAPILLARY: 164 mg/dL — AB (ref 70–99)
Glucose-Capillary: 145 mg/dL — ABNORMAL HIGH (ref 70–99)

## 2013-12-23 MED ORDER — HYDROCODONE-ACETAMINOPHEN 5-325 MG PO TABS: 1.0000 | ORAL_TABLET | Freq: Four times a day (QID) | ORAL | Status: AC | PRN

## 2013-12-23 MED ORDER — ASPIRIN 325 MG PO TBEC: 325.0000 mg | DELAYED_RELEASE_TABLET | Freq: Every day | ORAL | Status: AC

## 2013-12-23 NOTE — Progress Notes (Signed)
Clinical social worker assisted with patient discharge [facility] skilled [facility].  CSW addressed all family questions and concerns. CSW copied chart and added all important documents. CSW also set up patient transportation with [facility]. Clinical Social Worker will sign off for now as social work intervention is no longer needed.
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
## 2013-12-23 NOTE — Progress Notes (Signed)
[facility] Transported via ambulance in stable condition with belongings.

## 2013-12-23 NOTE — Progress Notes (Signed)
Patient ID: Brad Klein, male   DOB: 03/25/1929, 80 y.o.   MRN: [id number] Patient comfortable this morning. Anticipate discharge to skilled nursing facility today. I'll followup in the office in 2 weeks.

## 2013-12-23 NOTE — Care Management Note (Signed)
CARE MANAGEMENT NOTE 12/23/2013  Patient:  Brad BarcelonaAYLOR,Brad Klein   Account Number:  [id number]  Date Initiated:  12/22/2013  Documentation initiated by:  Vance PeperBRADY,Dawnielle Christiana  Subjective/Objective Assessment:   80 yr old male s/p right hip IM nailing.     Action/Plan:   PT/OT to eval, patient wants to go to rehab closer to home. CM notified Child psychotherapistocial worker.   Anticipated DC Date:  12/23/2013   Anticipated DC Plan:  SKILLED NURSING FACILITY  In-house referral  Clinical Social Worker      DC Planning Services  CM consult      Choice offered to / List presented to:     DME arranged  NA        HH arranged  NA      Status of service:  Completed, signed off Medicare Important Message given?   (If response is "NO", the following Medicare IM given date fields will be blank) Date Medicare IM given:   Date Additional Medicare IM given:    Discharge Disposition:  SKILLED NURSING FACILITY

## 2014-05-03 IMAGING — CR DG CHEST 1V
2 series · 2 of 2 positions shown · non-contrast
Comparison: 12/15/2013

CLINICAL DATA: Fall, diabetes, hypertension, right hip fracture

EXAM:
CHEST - 1 VIEW

[t chest supine (1 of 2)]
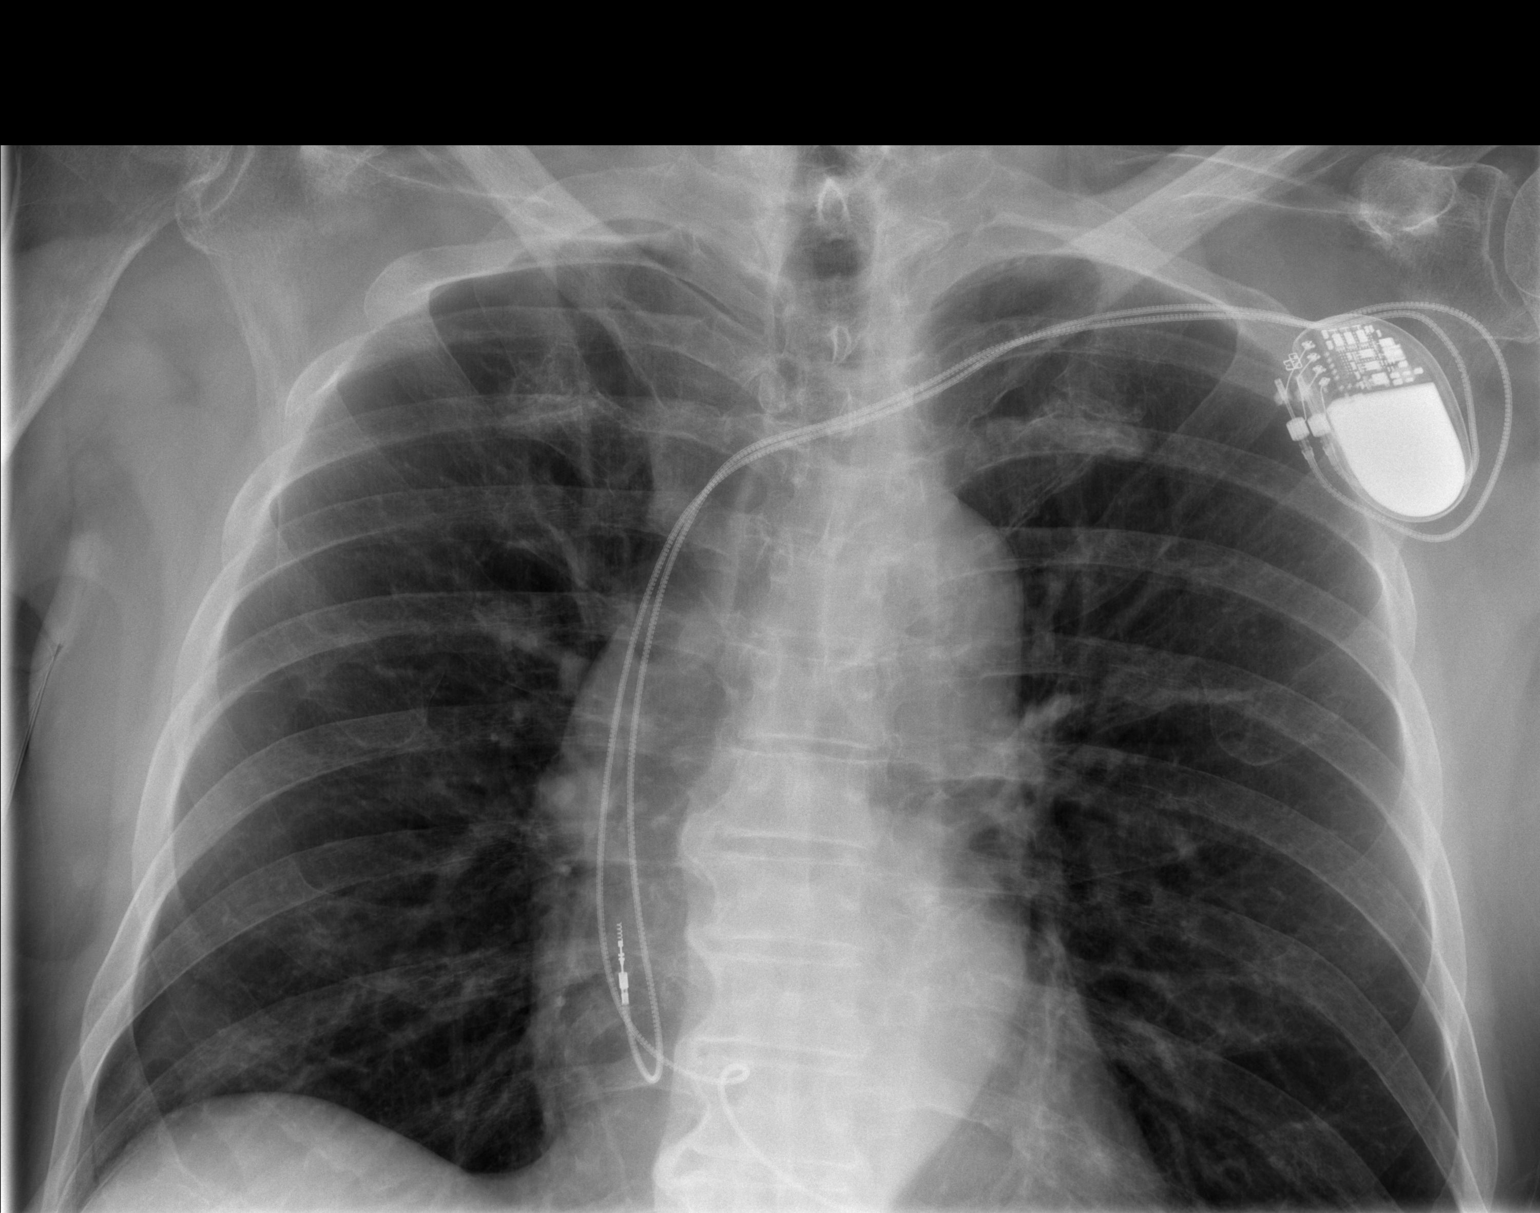

[t chest supine (2 of 2)]
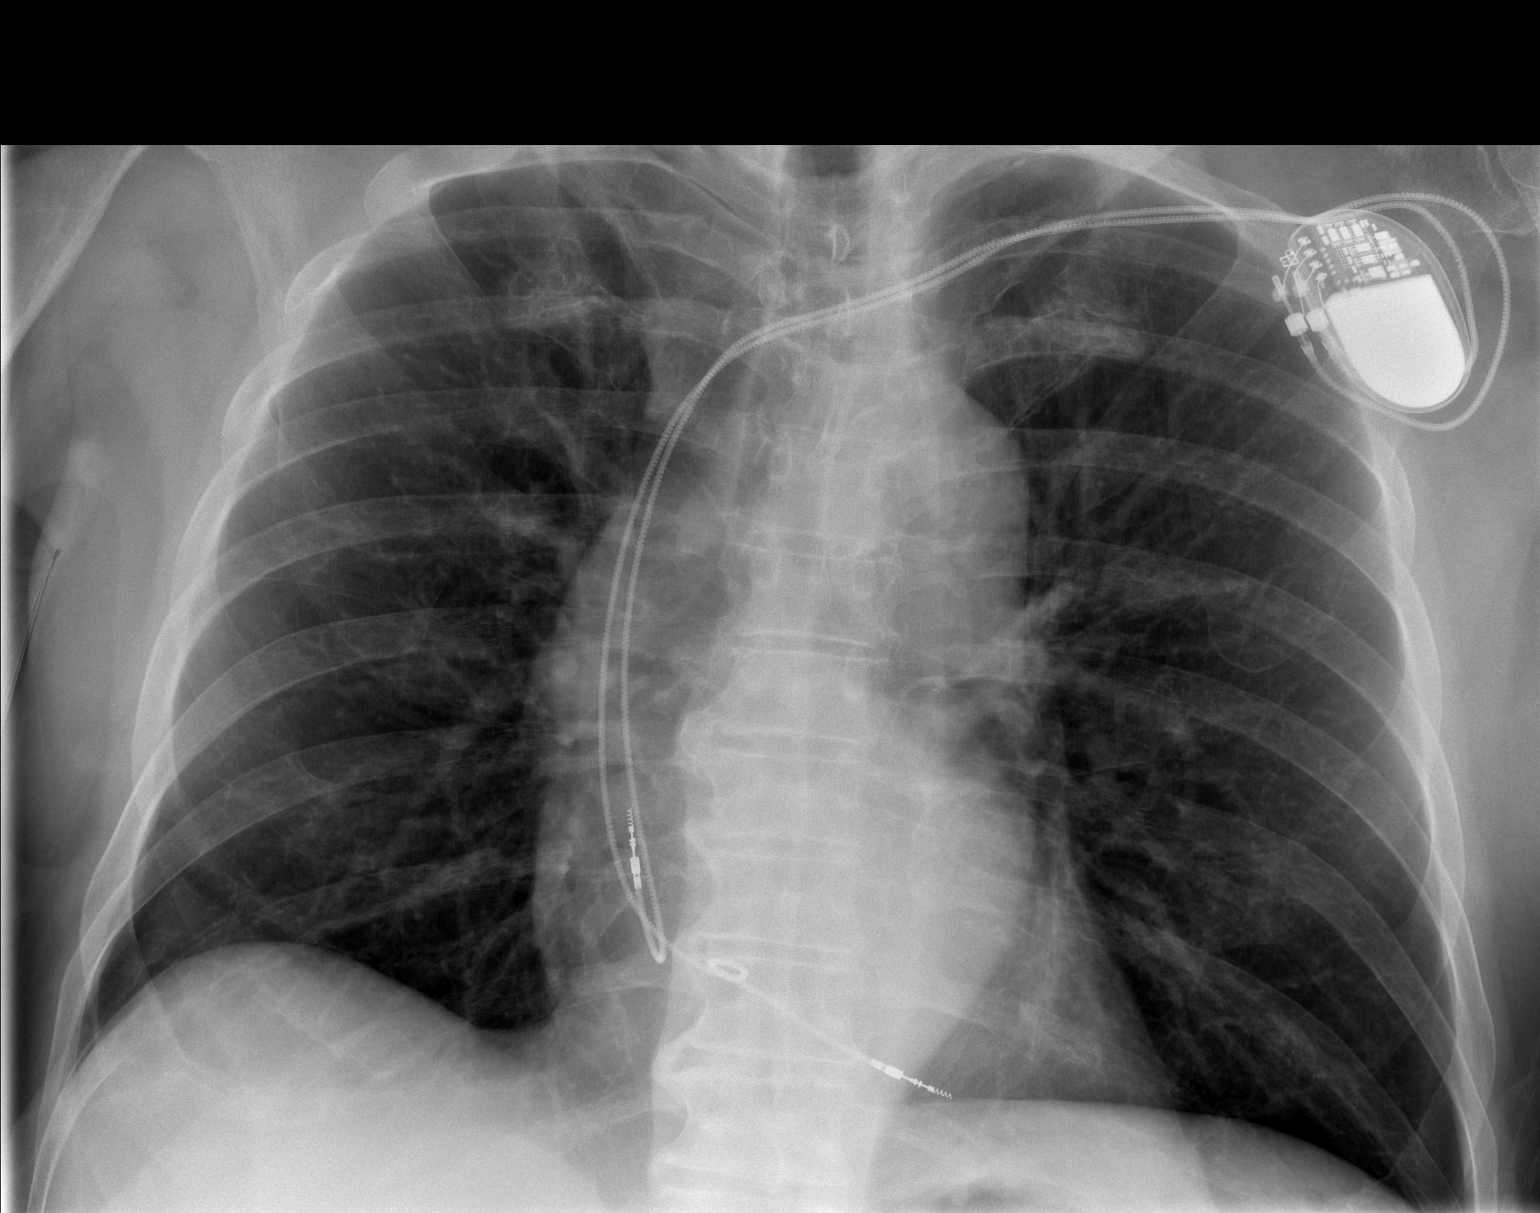

[2 of 2 positions shown; findings below may reference images not displayed]

FINDINGS: Left subclavian 2 lead pacer noted. Normal heart size and
vascularity. Mild hyperinflation without edema or pneumonia. No
effusion or pneumothorax. Trachea midline. Tortuous ectatic thoracic
aorta noted. Degenerative changes of the spine with an upper
thoracic scoliosis.
IMPRESSION: No superimposed acute process

## 2014-10-14 ENCOUNTER — Other Ambulatory Visit: Payer: Self-pay | Admitting: Family Medicine

## 2014-10-14 DIAGNOSIS — S72009G Fracture of unspecified part of neck of unspecified femur, subsequent encounter for closed fracture with delayed healing: Secondary | ICD-10-CM

## 2014-10-14 DIAGNOSIS — Z7952 Long term (current) use of systemic steroids: Secondary | ICD-10-CM

## 2014-10-21 ENCOUNTER — Other Ambulatory Visit: Payer: Self-pay | Admitting: Family Medicine

## 2014-10-21 DIAGNOSIS — E559 Vitamin D deficiency, unspecified: Secondary | ICD-10-CM

## 2014-10-28 ENCOUNTER — Other Ambulatory Visit: Payer: Medicare Other

## 2014-12-10 ENCOUNTER — Encounter (INDEPENDENT_AMBULATORY_CARE_PROVIDER_SITE_OTHER): Payer: Medicare Other | Admitting: Ophthalmology

## 2014-12-10 DIAGNOSIS — H4311 Vitreous hemorrhage, right eye: Secondary | ICD-10-CM

## 2014-12-10 DIAGNOSIS — E11329 Type 2 diabetes mellitus with mild nonproliferative diabetic retinopathy without macular edema: Secondary | ICD-10-CM | POA: Diagnosis not present

## 2014-12-10 DIAGNOSIS — E11359 Type 2 diabetes mellitus with proliferative diabetic retinopathy without macular edema: Secondary | ICD-10-CM | POA: Diagnosis not present

## 2014-12-10 DIAGNOSIS — H43813 Vitreous degeneration, bilateral: Secondary | ICD-10-CM | POA: Diagnosis not present

## 2014-12-10 DIAGNOSIS — I10 Essential (primary) hypertension: Secondary | ICD-10-CM

## 2014-12-10 DIAGNOSIS — H35033 Hypertensive retinopathy, bilateral: Secondary | ICD-10-CM | POA: Diagnosis not present

## 2014-12-10 DIAGNOSIS — E11319 Type 2 diabetes mellitus with unspecified diabetic retinopathy without macular edema: Secondary | ICD-10-CM | POA: Diagnosis not present

## 2015-02-08 ENCOUNTER — Encounter (INDEPENDENT_AMBULATORY_CARE_PROVIDER_SITE_OTHER): Payer: Medicare Other | Admitting: Ophthalmology

## 2015-02-17 ENCOUNTER — Encounter (INDEPENDENT_AMBULATORY_CARE_PROVIDER_SITE_OTHER): Payer: Medicare Other | Admitting: Ophthalmology

## 2015-02-26 ENCOUNTER — Encounter (INDEPENDENT_AMBULATORY_CARE_PROVIDER_SITE_OTHER): Payer: Medicare Other | Admitting: Ophthalmology

## 2015-03-11 ENCOUNTER — Encounter (INDEPENDENT_AMBULATORY_CARE_PROVIDER_SITE_OTHER): Payer: Medicare Other | Admitting: Ophthalmology

## 2016-08-30 DEATH — deceased

## 2016-12-01 ENCOUNTER — Encounter: Payer: Self-pay | Admitting: Internal Medicine
